# Patient Record
Sex: Female | Born: 1985 | Race: Black or African American | Hispanic: No | Marital: Married | Smoking: Never smoker
Health system: Southern US, Community
[De-identification: ages and names within clinical notes are randomized; demographics above are authoritative.]

## PROBLEM LIST (undated history)

## (undated) DIAGNOSIS — N760 Acute vaginitis: Secondary | ICD-10-CM

## (undated) DIAGNOSIS — N83209 Unspecified ovarian cyst, unspecified side: Secondary | ICD-10-CM

## (undated) DIAGNOSIS — H9190 Unspecified hearing loss, unspecified ear: Secondary | ICD-10-CM

## (undated) DIAGNOSIS — B009 Herpesviral infection, unspecified: Secondary | ICD-10-CM

## (undated) DIAGNOSIS — B9689 Other specified bacterial agents as the cause of diseases classified elsewhere: Secondary | ICD-10-CM

## (undated) DIAGNOSIS — A599 Trichomoniasis, unspecified: Secondary | ICD-10-CM

## (undated) DIAGNOSIS — N883 Incompetence of cervix uteri: Secondary | ICD-10-CM

## (undated) HISTORY — DX: Herpesviral infection, unspecified: B00.9

## (undated) HISTORY — DX: Unspecified ovarian cyst, unspecified side: N83.209

## (undated) HISTORY — PX: THERAPEUTIC ABORTION: SHX798

## (undated) HISTORY — DX: Incompetence of cervix uteri: N88.3

## (undated) HISTORY — PX: WISDOM TOOTH EXTRACTION: SHX21

## (undated) HISTORY — DX: Unspecified hearing loss, unspecified ear: H91.90

---

## 2017-09-20 ENCOUNTER — Inpatient Hospital Stay (HOSPITAL_COMMUNITY)
Admission: AD | Admit: 2017-09-20 | Discharge: 2017-09-20 | Disposition: A | Discharge status: Home / Self Care | Payer: Self-pay | Source: Ambulatory Visit | Attending: Obstetrics and Gynecology | Admitting: Obstetrics and Gynecology

## 2017-09-20 ENCOUNTER — Encounter (HOSPITAL_COMMUNITY): Payer: Self-pay

## 2017-09-20 DIAGNOSIS — R109 Unspecified abdominal pain: Secondary | ICD-10-CM | POA: Insufficient documentation

## 2017-09-20 DIAGNOSIS — N911 Secondary amenorrhea: Secondary | ICD-10-CM

## 2017-09-20 DIAGNOSIS — N912 Amenorrhea, unspecified: Secondary | ICD-10-CM | POA: Insufficient documentation

## 2017-09-20 DIAGNOSIS — Z3202 Encounter for pregnancy test, result negative: Secondary | ICD-10-CM | POA: Insufficient documentation

## 2017-09-20 HISTORY — DX: Other specified bacterial agents as the cause of diseases classified elsewhere: B96.89

## 2017-09-20 HISTORY — DX: Trichomoniasis, unspecified: A59.9

## 2017-09-20 HISTORY — DX: Acute vaginitis: N76.0

## 2017-09-20 LAB — CBC
HCT: 41.4 % (ref 36.0–46.0)
HEMOGLOBIN: 14.2 g/dL (ref 12.0–15.0)
MCH: 28.8 pg (ref 26.0–34.0)
MCHC: 34.3 g/dL (ref 30.0–36.0)
MCV: 84 fL (ref 78.0–100.0)
PLATELETS: 274 10*3/uL (ref 150–400)
RBC: 4.93 MIL/uL (ref 3.87–5.11)
RDW: 14.5 % (ref 11.5–15.5)
WBC: 6.5 10*3/uL (ref 4.0–10.5)

## 2017-09-20 LAB — WET PREP, GENITAL
Clue Cells Wet Prep HPF POC: NONE SEEN
SPERM: NONE SEEN
Trich, Wet Prep: NONE SEEN
Yeast Wet Prep HPF POC: NONE SEEN

## 2017-09-20 LAB — URINALYSIS, ROUTINE W REFLEX MICROSCOPIC
BILIRUBIN URINE: NEGATIVE
Glucose, UA: NEGATIVE mg/dL
Hgb urine dipstick: NEGATIVE
Ketones, ur: NEGATIVE mg/dL
Leukocytes, UA: NEGATIVE
Nitrite: NEGATIVE
Protein, ur: NEGATIVE mg/dL
SPECIFIC GRAVITY, URINE: 1.028 (ref 1.005–1.030)
pH: 6 (ref 5.0–8.0)

## 2017-09-20 LAB — HEPATITIS B SURFACE ANTIGEN: HEP B S AG: NEGATIVE

## 2017-09-20 LAB — POCT PREGNANCY, URINE: PREG TEST UR: NEGATIVE

## 2017-09-20 MED ORDER — KETOROLAC TROMETHAMINE 30 MG/ML IJ SOLN
60.0000 mg | Freq: Once | INTRAMUSCULAR | Status: DC
  Filled 2017-09-20: qty 2

## 2017-09-20 MED ORDER — KETOROLAC TROMETHAMINE 60 MG/2ML IM SOLN
60.0000 mg | Freq: Once | INTRAMUSCULAR | Status: AC
  Administered 2017-09-20: 60 mg via INTRAMUSCULAR
  Filled 2017-09-20: qty 2

## 2017-09-20 MED ORDER — IBUPROFEN 800 MG PO TABS: 800.0000 mg | ORAL_TABLET | Freq: Three times a day (TID) | ORAL | 0 refills | Status: DC | PRN

## 2017-09-20 NOTE — Discharge Instructions (Signed)
Secondary Amenorrhea Secondary amenorrhea is the stopping of menstrual flow for 3-6 months in a female who has previously had periods. There are many possible causes. Most of these causes are not serious. Usually, treating the underlying problem causing the loss of menses will return your periods to normal. What are the causes? Some common and uncommon causes of not menstruating include:  Malnutrition.  Low blood sugar (hypoglycemia).  Polycystic ovary disease.  Stress or fear.  Breastfeeding.  Hormone imbalance.  Ovarian failure.  Medicines.  Extreme obesity.  Cystic fibrosis.  Low body weight or drastic weight reduction from any cause.  Early menopause.  Removal of ovaries or uterus.  Contraceptives.  Illness.  Long-term (chronic) illnesses.  Cushing syndrome.  Thyroid problems.  Birth control pills, patches, or vaginal rings for birth control.  What increases the risk? You may be at greater risk of secondary amenorrhea if:  You have a family history of this condition.  You have an eating disorder.  You do athletic training.  How is this diagnosed? A diagnosis is made by your health care provider taking a medical history and doing a physical exam. This will include a pelvic exam to check for problems with your reproductive organs. Pregnancy must be ruled out. Often, numerous blood tests are done to measure different hormones in the body. Urine testing may be done. Specialized exams (ultrasound, CT scan, MRI, or hysteroscopy) may have to be done as well as measuring the body mass index (BMI). How is this treated? Treatment depends on the cause of the amenorrhea. If an eating disorder is present, this can be treated with an adequate diet and therapy. Chronic illnesses may improve with treatment of the illness. Amenorrhea may be corrected with medicines, lifestyle changes, or surgery. If the amenorrhea cannot be corrected, it is sometimes possible to create a  false menstruation with medicines. Follow these instructions at home:  Maintain a healthy diet.  Manage weight problems.  Exercise regularly but not excessively.  Get adequate sleep.  Manage stress.  Be aware of changes in your menstrual cycle. Keep a record of when your periods occur. Note the date your period starts, how long it lasts, and any problems. Contact a health care provider if: Your symptoms do not get better with treatment. This information is not intended to replace advice given to you by your health care provider. Make sure you discuss any questions you have with your health care provider. Document Released: 08/02/2006 Document Revised: 11/27/2015 Document Reviewed: 12/07/2012 Elsevier Interactive Patient Education  2018 Elsevier Inc.  

## 2017-09-20 NOTE — MAU Provider Note (Signed)
History     CSN: [id number]  Arrival date and time: 09/20/17 1239   First Provider Initiated Contact with Patient 09/20/17 1552      Chief Complaint  Patient presents with  . Possible Pregnancy  . Abdominal Pain  . Nausea   32 y.o. non-pregnant female here with missed menses, abdominal pain, nausea, and back pain. Abd and back pain started about 1 month ago. Describes as intermittent and cramping. Took Ibuprofen but didn't help much. No fevers. No urinary sx. Denies vaginal discharge. Had a new partner recently but no longer together. Remote hx of trich. LMP was 07/20/17.    Past Medical History:  Diagnosis Date  . BV (bacterial vaginosis)   . Trichimoniasis     Past Surgical History:  Procedure Laterality Date  . CESAREAN SECTION    . THERAPEUTIC ABORTION      History reviewed. No pertinent family history.  Social History   Tobacco Use  . Smoking status: Never Smoker  . Smokeless tobacco: Never Used  Substance Use Topics  . Alcohol use: Yes  . Drug use: No    Allergies: No Known Allergies  Medications Prior to Admission  Medication Sig Dispense Refill Last Dose  . Multiple Vitamin (MULTIVITAMIN WITH MINERALS) TABS tablet Take 1 tablet by mouth daily.   09/20/2017 at Unknown time    Review of Systems  Constitutional: Negative for chills and fever.  Gastrointestinal: Positive for abdominal pain and nausea. Negative for constipation, diarrhea and vomiting.  Genitourinary: Negative for dysuria, hematuria, urgency, vaginal bleeding and vaginal discharge.   Physical Exam   Blood pressure 123/73, pulse 84, temperature 98.9 F (37.2 C), temperature source Oral, resp. rate 15, height 5\' 4"  (1.626 m), weight 253 lb (114.8 kg), last menstrual period 07/20/2017, SpO2 100 %.  Physical Exam  Nursing note and vitals reviewed. Constitutional: She is oriented to person, place, and time. She appears well-developed and well-nourished. No distress.  HENT:  Head:  Normocephalic and atraumatic.  Neck: Normal range of motion.  Cardiovascular: Normal rate.  Respiratory: Effort normal. No respiratory distress.  GI: Soft. She exhibits no distension and no mass. There is no tenderness. There is no rebound and no guarding.  Genitourinary:  Genitourinary Comments: External: no lesions or erythema Vagina: rugated, pink, moist, mucopurulent discharge from os Uterus: non enlarged, anteverted, non tender, no CMT Adnexae: no masses, no tenderness left, no tenderness right    Musculoskeletal: Normal range of motion.  Neurological: She is alert and oriented to person, place, and time.  Skin: Skin is warm and dry.  Psychiatric: She has a normal mood and affect.   Results for orders placed or performed during the hospital encounter of 09/20/17 (from the past 24 hour(s))  Urinalysis, Routine w reflex microscopic     Status: None   Collection Time: 09/20/17  1:25 PM  Result Value Ref Range   Color, Urine YELLOW YELLOW   APPearance CLEAR CLEAR   Specific Gravity, Urine 1.028 1.005 - 1.030   pH 6.0 5.0 - 8.0   Glucose, UA NEGATIVE NEGATIVE mg/dL   Hgb urine dipstick NEGATIVE NEGATIVE   Bilirubin Urine NEGATIVE NEGATIVE   Ketones, ur NEGATIVE NEGATIVE mg/dL   Protein, ur NEGATIVE NEGATIVE mg/dL   Nitrite NEGATIVE NEGATIVE   Leukocytes, UA NEGATIVE NEGATIVE  Pregnancy, urine POC     Status: None   Collection Time: 09/20/17  1:57 PM  Result Value Ref Range   Preg Test, Ur NEGATIVE NEGATIVE  Wet prep, genital  Status: Abnormal   Collection Time: 09/20/17  4:40 PM  Result Value Ref Range   Yeast Wet Prep HPF POC NONE SEEN NONE SEEN   Trich, Wet Prep NONE SEEN NONE SEEN   Clue Cells Wet Prep HPF POC NONE SEEN NONE SEEN   WBC, Wet Prep HPF POC MODERATE (A) NONE SEEN   Sperm NONE SEEN   CBC     Status: None   Collection Time: 09/20/17  5:04 PM  Result Value Ref Range   WBC 6.5 4.0 - 10.5 K/uL   RBC 4.93 3.87 - 5.11 MIL/uL   Hemoglobin 14.2 12.0 - 15.0  g/dL   HCT 57.8 46.9 - 62.9 %   MCV 84.0 78.0 - 100.0 fL   MCH 28.8 26.0 - 34.0 pg   MCHC 34.3 30.0 - 36.0 g/dL   RDW 52.8 41.3 - 24.4 %   Platelets 274 150 - 400 K/uL   MAU Course  Procedures Toradol  MDM Labs ordered and reviewed. Pain improved. No evidence of pregnancy. Suspect Chlamydia d/t cervical discharge. Will wait for culture result. No evidence of acute abdominal or pelvic process. Amenorrhea could reflect anovulatory cycle. Recommend HPT weekly until menses start. Stable for discharge home.  Assessment and Plan   1. Amenorrhea, secondary   2. Pregnancy examination or test, negative result    Discharge home Follow up with GYN provider of choice to start care Rx Ibuprofen  Allergies as of 09/20/2017   No Known Allergies     Medication List    TAKE these medications   ibuprofen 800 MG tablet Commonly known as:  ADVIL,MOTRIN Take 1 tablet (800 mg total) by mouth every 8 (eight) hours as needed.   multivitamin with minerals Tabs tablet Take 1 tablet by mouth daily.      Donette Larry, CNM 09/20/2017, 4:24 PM

## 2017-09-20 NOTE — MAU Provider Note (Signed)
CC: missed period  HPI: Ms. Emily Valenzuela is a 32yo female who presents with a missed period. Her LMP was 07/20/17. Her home pregnancy tests have been negative. She complains of generalized lower abominal cramping for the past month as well as lower back pain. She complains of nausea but no vomiting. She also notes a sharp pain in her breast that happens occasionally. She is sexually active. She denies vaginal bleeding, dysuria, fever, or chills.  Objective:  PE:  General: well appearing, in no distress CV: RRR Pulmonary: CTAB Abdominal: mild generalized lower abdominal tenderness to palpation no organomegaly or masses. Pelvic: mucopurulent discharge from the cervical os otherwise normal exam. Bimanual exam revealed no masses or CMT.  Urinalysis    Component Value Date/Time   COLORURINE YELLOW 09/20/2017 1325   APPEARANCEUR CLEAR 09/20/2017 1325   LABSPEC 1.028 09/20/2017 1325   PHURINE 6.0 09/20/2017 1325   GLUCOSEU NEGATIVE 09/20/2017 1325   HGBUR NEGATIVE 09/20/2017 1325   BILIRUBINUR NEGATIVE 09/20/2017 1325   KETONESUR NEGATIVE 09/20/2017 1325   PROTEINUR NEGATIVE 09/20/2017 1325   NITRITE NEGATIVE 09/20/2017 1325   LEUKOCYTESUR NEGATIVE 09/20/2017 1325   Pregnancy test negative  Wet mount  G/c - pending  A/P: Ms. Emily Valenzuela is a 32 yo female who presents with a missed period and abdominal cramping. Her pregnancy test was negative today. Given her pelvic exam concern for STI.  Plan:  -G/C, [place], RPR, HIV, HepB, CBC

## 2017-09-20 NOTE — MAU Note (Signed)
Pt reports she is two months late for her period. Has done multiple preg test and they have all been negative but pt states she is having nausea sometimes, urinary frequency and states she has never been this late for her period.

## 2017-09-20 NOTE — Progress Notes (Signed)
Pt signed paper discharge instructions, put with chart.

## 2017-09-21 LAB — GC/CHLAMYDIA PROBE AMP (~~LOC~~) NOT AT ARMC
Chlamydia: NEGATIVE
Neisseria Gonorrhea: NEGATIVE

## 2017-09-21 LAB — RPR: RPR Ser Ql: NONREACTIVE

## 2017-09-21 LAB — HIV ANTIBODY (ROUTINE TESTING W REFLEX): HIV SCREEN 4TH GENERATION: NONREACTIVE

## 2018-10-25 DIAGNOSIS — Z6841 Body Mass Index (BMI) 40.0 and over, adult: Secondary | ICD-10-CM | POA: Diagnosis not present

## 2018-10-25 DIAGNOSIS — R635 Abnormal weight gain: Secondary | ICD-10-CM | POA: Diagnosis not present

## 2018-10-25 DIAGNOSIS — F064 Anxiety disorder due to known physiological condition: Secondary | ICD-10-CM | POA: Diagnosis not present

## 2018-10-25 DIAGNOSIS — F3289 Other specified depressive episodes: Secondary | ICD-10-CM | POA: Diagnosis not present

## 2018-10-25 DIAGNOSIS — I10 Essential (primary) hypertension: Secondary | ICD-10-CM | POA: Diagnosis not present

## 2018-12-13 DIAGNOSIS — E785 Hyperlipidemia, unspecified: Secondary | ICD-10-CM | POA: Diagnosis not present

## 2018-12-13 DIAGNOSIS — E669 Obesity, unspecified: Secondary | ICD-10-CM | POA: Diagnosis not present

## 2019-02-09 ENCOUNTER — Emergency Department (HOSPITAL_COMMUNITY)
Admission: EM | Admit: 2019-02-09 | Discharge: 2019-02-10 | Disposition: A | Discharge status: Home / Self Care | Payer: BC Managed Care – PPO | Attending: Emergency Medicine | Admitting: Emergency Medicine

## 2019-02-09 ENCOUNTER — Emergency Department (HOSPITAL_COMMUNITY): Payer: BC Managed Care – PPO

## 2019-02-09 ENCOUNTER — Encounter (HOSPITAL_COMMUNITY): Payer: Self-pay | Admitting: Emergency Medicine

## 2019-02-09 ENCOUNTER — Other Ambulatory Visit: Payer: Self-pay

## 2019-02-09 DIAGNOSIS — Z79899 Other long term (current) drug therapy: Secondary | ICD-10-CM | POA: Insufficient documentation

## 2019-02-09 DIAGNOSIS — R079 Chest pain, unspecified: Secondary | ICD-10-CM | POA: Diagnosis not present

## 2019-02-09 DIAGNOSIS — F419 Anxiety disorder, unspecified: Secondary | ICD-10-CM | POA: Insufficient documentation

## 2019-02-09 LAB — CBC
HCT: 43.4 % (ref 36.0–46.0)
Hemoglobin: 14.3 g/dL (ref 12.0–15.0)
MCH: 28.4 pg (ref 26.0–34.0)
MCHC: 32.9 g/dL (ref 30.0–36.0)
MCV: 86.3 fL (ref 80.0–100.0)
Platelets: 348 10*3/uL (ref 150–400)
RBC: 5.03 MIL/uL (ref 3.87–5.11)
RDW: 13.7 % (ref 11.5–15.5)
WBC: 6.5 10*3/uL (ref 4.0–10.5)
nRBC: 0 % (ref 0.0–0.2)

## 2019-02-09 LAB — BASIC METABOLIC PANEL
Anion gap: 10 (ref 5–15)
BUN: 9 mg/dL (ref 6–20)
CO2: 23 mmol/L (ref 22–32)
Calcium: 9.4 mg/dL (ref 8.9–10.3)
Chloride: 100 mmol/L (ref 98–111)
Creatinine, Ser: 0.83 mg/dL (ref 0.44–1.00)
GFR calc Af Amer: >60 mL/min (ref >60)
GFR calc non Af Amer: >60 mL/min (ref >60)
Glucose, Bld: 105 mg/dL — ABNORMAL HIGH (ref 70–99)
Potassium: 3.5 mmol/L (ref 3.5–5.1)
Sodium: 133 mmol/L — ABNORMAL LOW (ref 135–145)

## 2019-02-09 LAB — I-STAT BETA HCG BLOOD, ED (MC, WL, AP ONLY): I-stat hCG, quantitative: <5 m[IU]/mL (ref <5)

## 2019-02-09 LAB — TROPONIN I (HIGH SENSITIVITY): Troponin I (High Sensitivity): <2 ng/L (ref <18)

## 2019-02-09 MED ORDER — SODIUM CHLORIDE 0.9% FLUSH: 3.0000 mL | Freq: Once | INTRAVENOUS | Status: DC

## 2019-02-09 NOTE — ED Triage Notes (Signed)
Pt here for eval of anxietyrelated to recent loss of her friend. States she began having centralized chest pain that radiates to the left shoulder blade since yesterday. Pt also has generalized ankle pain to left ankle.

## 2019-02-10 MED ORDER — ACETAMINOPHEN 500 MG PO TABS
1000.0000 mg | ORAL_TABLET | Freq: Once | ORAL | Status: AC
  Administered 2019-02-10: 1000 mg via ORAL
  Filled 2019-02-10: qty 2

## 2019-02-10 NOTE — ED Notes (Signed)
Cancel tropon

## 2019-02-10 NOTE — ED Provider Notes (Signed)
[facility]   CSN: [id number] Arrival date & time: 02/09/19  1837     History   Chief Complaint Chief Complaint  Patient presents with  . Anxiety  . Chest Pain    HPI Emily Valenzuela is a 33 y.o. female.     Patient presents to the emergency department with a chief complaint of chest pain.  She states that her symptoms started a day or so ago.  She states that she recently lost 1 of her friends.  She has had several people calling her to talk about it, and she states that this is made her feel very anxious.  She reports associated chest pain.  She denies any shortness of breath, or cough.  She denies any history of PE or DVT.  Denies any recent long travel, immobilization, OCPs.  She has not taken anything for symptoms.  Symptoms are worsened when thinking about/talking about her friend.  The history is provided by the patient. No language interpreter was used.    Past Medical History:  Diagnosis Date  . BV (bacterial vaginosis)   . Trichimoniasis     There are no active problems to display for this patient.   Past Surgical History:  Procedure Laterality Date  . CESAREAN SECTION    . THERAPEUTIC ABORTION       OB History    Gravida  3   Para  1   Term      Preterm  1   AB  2   Living        SAB  1   TAB  1   Ectopic      Multiple      Live Births               Home Medications    Prior to Admission medications   Medication Sig Start Date End Date Taking? Authorizing Provider  ibuprofen (ADVIL,MOTRIN) 800 MG tablet Take 1 tablet (800 mg total) by mouth every 8 (eight) hours as needed. 09/20/17   Donette LarryBhambri, Melanie, CNM  Multiple Vitamin (MULTIVITAMIN WITH MINERALS) TABS tablet Take 1 tablet by mouth daily.    [provider]    Family History No family history on file.  Social History Social History   Tobacco Use  . Smoking status: Never Smoker  . Smokeless tobacco: Never Used   Substance Use Topics  . Alcohol use: Yes  . Drug use: No     Allergies   Patient has no known allergies.   Review of Systems Review of Systems  All other systems reviewed and are negative.    Physical Exam Updated Vital Signs BP 131/83 (BP Location: Left Arm)   Pulse 80   Temp 98.7 F (37.1 C) (Oral)   Resp 16   LMP 02/09/2019   SpO2 100%   Physical Exam Vitals signs and nursing note reviewed.  Constitutional:      General: She is not in acute distress.    Appearance: She is well-developed.  HENT:     Head: Normocephalic and atraumatic.  Eyes:     Conjunctiva/sclera: Conjunctivae normal.  Neck:     Musculoskeletal: Neck supple.  Cardiovascular:     Rate and Rhythm: Normal rate and regular rhythm.     Heart sounds: No murmur.  Pulmonary:     Effort: Pulmonary effort is normal. No respiratory distress.     Breath sounds: Normal breath sounds.  Abdominal:  Palpations: Abdomen is soft.     Tenderness: There is no abdominal tenderness.  Musculoskeletal: Normal range of motion.  Skin:    General: Skin is warm and dry.  Neurological:     Mental Status: She is alert and oriented to person, place, and time.      ED Treatments / Results  Labs (all labs ordered are listed, but only abnormal results are displayed) Labs Reviewed  BASIC METABOLIC PANEL - Abnormal; Notable for the following components:      Result Value   Sodium 133 (*)    Glucose, Bld 105 (*)    All other components within normal limits  CBC  I-STAT BETA HCG BLOOD, ED ([facility], [facility], [facility] ONLY)  TROPONIN I (HIGH SENSITIVITY)  TROPONIN I (HIGH SENSITIVITY)    EKG None  Radiology Dg Chest 2 View  Result Date: 02/09/2019 CLINICAL DATA:  Chest pain. EXAM: CHEST - 2 VIEW COMPARISON:  None. FINDINGS: The heart size and mediastinal contours are within normal limits. Both lungs are clear. The visualized skeletal structures are unremarkable. IMPRESSION: No active cardiopulmonary disease. Electronically  Signed   By: Dorise Bullion III M.D   On: 02/09/2019 19:39    Procedures Procedures (including critical care time)  Medications Ordered in ED Medications  sodium chloride flush (NS) 0.9 % injection 3 mL (has no administration in time range)     Initial Impression / Assessment and Plan / ED Course  I have reviewed the triage vital signs and the nursing notes.  Pertinent labs & imaging results that were available during my care of the patient were reviewed by me and considered in my medical decision making (see chart for details).        Patient with chest pain that started yesterday.  Troponin negative.  Low risk for ACS based on age and lack of risk factors.  Low risk for PE, PERC negative.  Recently lost 1 of her friends.  Likely anxiety/stress induced.  EKG is reassuring.  We will discharged home with primary care follow-up.  Encourage patient to consider talking with a Social worker.  Final Clinical Impressions(s) / ED Diagnoses   Final diagnoses:  Anxiety  Chest pain, unspecified type    ED Discharge Orders    None       Montine Circle, PA-C 62/69/48 [id number]    Delora Fuel, MD 70/35/00 [phone]

## 2019-02-13 DIAGNOSIS — F411 Generalized anxiety disorder: Secondary | ICD-10-CM | POA: Diagnosis not present

## 2019-02-15 DIAGNOSIS — F3289 Other specified depressive episodes: Secondary | ICD-10-CM | POA: Diagnosis not present

## 2019-02-15 DIAGNOSIS — E669 Obesity, unspecified: Secondary | ICD-10-CM | POA: Diagnosis not present

## 2019-02-15 DIAGNOSIS — I10 Essential (primary) hypertension: Secondary | ICD-10-CM | POA: Diagnosis not present

## 2019-02-15 DIAGNOSIS — M940 Chondrocostal junction syndrome [Tietze]: Secondary | ICD-10-CM | POA: Diagnosis not present

## 2019-02-20 DIAGNOSIS — F411 Generalized anxiety disorder: Secondary | ICD-10-CM | POA: Diagnosis not present

## 2019-02-23 ENCOUNTER — Other Ambulatory Visit: Payer: Self-pay

## 2019-06-07 ENCOUNTER — Other Ambulatory Visit: Payer: Self-pay

## 2019-06-07 DIAGNOSIS — Z20822 Contact with and (suspected) exposure to covid-19: Secondary | ICD-10-CM

## 2019-06-09 LAB — NOVEL CORONAVIRUS, NAA: SARS-CoV-2, NAA: NOT DETECTED

## 2019-09-21 DIAGNOSIS — H5213 Myopia, bilateral: Secondary | ICD-10-CM | POA: Diagnosis not present

## 2019-09-27 ENCOUNTER — Ambulatory Visit: Payer: BC Managed Care – PPO

## 2019-10-12 DIAGNOSIS — N883 Incompetence of cervix uteri: Secondary | ICD-10-CM | POA: Diagnosis not present

## 2019-10-12 DIAGNOSIS — Z113 Encounter for screening for infections with a predominantly sexual mode of transmission: Secondary | ICD-10-CM | POA: Diagnosis not present

## 2019-10-12 DIAGNOSIS — Z01419 Encounter for gynecological examination (general) (routine) without abnormal findings: Secondary | ICD-10-CM | POA: Diagnosis not present

## 2019-10-12 DIAGNOSIS — Z6841 Body Mass Index (BMI) 40.0 and over, adult: Secondary | ICD-10-CM | POA: Diagnosis not present

## 2019-10-12 DIAGNOSIS — R350 Frequency of micturition: Secondary | ICD-10-CM | POA: Diagnosis not present

## 2019-10-12 DIAGNOSIS — Z124 Encounter for screening for malignant neoplasm of cervix: Secondary | ICD-10-CM | POA: Diagnosis not present

## 2019-10-12 DIAGNOSIS — Z3040 Encounter for surveillance of contraceptives, unspecified: Secondary | ICD-10-CM | POA: Diagnosis not present

## 2019-10-17 DIAGNOSIS — B009 Herpesviral infection, unspecified: Secondary | ICD-10-CM | POA: Diagnosis not present

## 2019-10-22 ENCOUNTER — Ambulatory Visit: Payer: BC Managed Care – PPO

## 2019-11-20 DIAGNOSIS — N925 Other specified irregular menstruation: Secondary | ICD-10-CM | POA: Diagnosis not present

## 2019-11-28 DIAGNOSIS — O26859 Spotting complicating pregnancy, unspecified trimester: Secondary | ICD-10-CM | POA: Diagnosis not present

## 2019-11-28 DIAGNOSIS — Z3201 Encounter for pregnancy test, result positive: Secondary | ICD-10-CM | POA: Diagnosis not present

## 2019-11-28 DIAGNOSIS — Z113 Encounter for screening for infections with a predominantly sexual mode of transmission: Secondary | ICD-10-CM | POA: Diagnosis not present

## 2019-11-28 DIAGNOSIS — N911 Secondary amenorrhea: Secondary | ICD-10-CM | POA: Diagnosis not present

## 2019-11-28 DIAGNOSIS — Z8751 Personal history of pre-term labor: Secondary | ICD-10-CM | POA: Diagnosis not present

## 2019-11-29 ENCOUNTER — Other Ambulatory Visit (HOSPITAL_COMMUNITY): Payer: Self-pay | Admitting: Obstetrics and Gynecology

## 2019-11-29 DIAGNOSIS — O009 Unspecified ectopic pregnancy without intrauterine pregnancy: Secondary | ICD-10-CM

## 2019-12-11 ENCOUNTER — Ambulatory Visit
Admission: RE | Admit: 2019-12-11 | Discharge: 2019-12-11 | Disposition: A | Discharge status: Home / Self Care | Payer: Medicaid Other | Source: Ambulatory Visit | Attending: Obstetrics and Gynecology | Admitting: Obstetrics and Gynecology

## 2019-12-11 ENCOUNTER — Other Ambulatory Visit: Payer: Self-pay

## 2019-12-11 DIAGNOSIS — O009 Unspecified ectopic pregnancy without intrauterine pregnancy: Secondary | ICD-10-CM

## 2019-12-26 DIAGNOSIS — Z8751 Personal history of pre-term labor: Secondary | ICD-10-CM | POA: Diagnosis not present

## 2019-12-26 DIAGNOSIS — Z3A10 10 weeks gestation of pregnancy: Secondary | ICD-10-CM | POA: Diagnosis not present

## 2019-12-26 DIAGNOSIS — N883 Incompetence of cervix uteri: Secondary | ICD-10-CM | POA: Diagnosis not present

## 2019-12-26 DIAGNOSIS — O3680X9 Pregnancy with inconclusive fetal viability, other fetus: Secondary | ICD-10-CM | POA: Diagnosis not present

## 2019-12-26 DIAGNOSIS — N925 Other specified irregular menstruation: Secondary | ICD-10-CM | POA: Diagnosis not present

## 2019-12-26 DIAGNOSIS — S29011A Strain of muscle and tendon of front wall of thorax, initial encounter: Secondary | ICD-10-CM | POA: Diagnosis not present

## 2019-12-26 DIAGNOSIS — Z113 Encounter for screening for infections with a predominantly sexual mode of transmission: Secondary | ICD-10-CM | POA: Diagnosis not present

## 2020-02-06 DIAGNOSIS — Z3482 Encounter for supervision of other normal pregnancy, second trimester: Secondary | ICD-10-CM | POA: Diagnosis not present

## 2020-02-13 DIAGNOSIS — Z3A17 17 weeks gestation of pregnancy: Secondary | ICD-10-CM | POA: Diagnosis not present

## 2020-02-13 DIAGNOSIS — N760 Acute vaginitis: Secondary | ICD-10-CM | POA: Diagnosis not present

## 2020-02-13 DIAGNOSIS — Z363 Encounter for antenatal screening for malformations: Secondary | ICD-10-CM | POA: Diagnosis not present

## 2020-02-13 DIAGNOSIS — Z8751 Personal history of pre-term labor: Secondary | ICD-10-CM | POA: Diagnosis not present

## 2020-02-13 DIAGNOSIS — O09219 Supervision of pregnancy with history of pre-term labor, unspecified trimester: Secondary | ICD-10-CM | POA: Diagnosis not present

## 2020-02-18 DIAGNOSIS — Z362 Encounter for other antenatal screening follow-up: Secondary | ICD-10-CM | POA: Diagnosis not present

## 2020-02-19 ENCOUNTER — Other Ambulatory Visit: Payer: Self-pay | Admitting: Obstetrics & Gynecology

## 2020-02-20 ENCOUNTER — Encounter (HOSPITAL_COMMUNITY): Payer: Self-pay

## 2020-02-20 ENCOUNTER — Ambulatory Visit (HOSPITAL_COMMUNITY): Admit: 2020-02-20 | Payer: Medicaid Other | Admitting: Obstetrics & Gynecology

## 2020-02-20 SURGERY — CERCLAGE, CERVIX, VAGINAL APPROACH
Anesthesia: Choice

## 2020-02-23 NOTE — H&P (Signed)
Nara Paternoster is a 34 y.o. female presenting for history indicated prophylactic cerclage.  She is a G4P0120 at 18 weeks 5 days EGA with a history of cervical incompetence and preterm labor in her last pregnancy that resulted in 24 week delivery by cesarean section.  EDC is by LMP of 10/17/2019,EDC 07/23/2020 (confirmed by first trimester ultrasound).    OB History    Gravida  3   Para  1   Term      Preterm  1   AB  2   Living        SAB  1   TAB  1   Ectopic      Multiple      Live Births             Past Medical History:  Diagnosis Date  . BV (bacterial vaginosis)   . Trichimoniasis    Past Surgical History:  Procedure Laterality Date  . CESAREAN SECTION    . THERAPEUTIC ABORTION     Family History: family history is not on file. Social History:  reports that she has never smoked. She has never used smokeless tobacco. She reports current alcohol use. She reports that she does not use drugs.     Maternal Diabetes: No Genetic Screening: Normal Maternal Ultrasounds/Referrals: Normal Fetal Ultrasounds or other Referrals:  None Maternal Substance Abuse:  No Significant Maternal Medications:  Meds include: Progesterone Significant Maternal Lab Results:  None Other Comments:  None  Review of Systems  Constitutional: Denies fevers/chills Cardiovascular: Denies chest pain or palpitations Pulmonary: Denies coughing or wheezing Gastrointestinal: Denies nausea, vomiting or diarrhea Genitourinary: Denies pelvic pain, unusual vaginal bleeding, unusual vaginal discharge, dysuria, urgency or frequency.  Musculoskeletal: Denies muscle or joint aches and pain.  Neurology: Denies abnormal sensations such as tingling or numbness.   History   There were no vitals taken for this visit.  Blood pressure (!) 142/71, pulse 100, temperature 98.2 F (36.8 C), temperature source Oral, resp. rate 18, height 5\' 4"  (1.626 m), weight 124.3 kg, SpO2 100 %. Exam Physical Exam   Vitals reviewed. Constitutional: She is oriented to person, place, and time. She appears well-developed and well-nourished.  HENT:  Head: Normocephalic and atraumatic.  Eyes: Conjunctivae and EOM are normal.  Neck: Normal range of motion. Neck supple.  Cardiovascular: Normal rate, regular rhythm, normal heart sounds and intact distal pulses.  Respiratory: Effort normal and breath sounds normal.  GI: Soft. She exhibits no mass. There is no tenderness.  Genitourinary: Vagina normal and uterus normal.  Musculoskeletal: Normal range of motion.  Neurological: She is alert and oriented to person, place, and time.  Skin: Skin is warm and dry.  Psychiatric: She has a normal mood and affect. Judgment normal.   Current Outpatient Medications  Medication Instructions  . HYDROXYprogesterone Caproate (MAKENA IM) Intramuscular, Weekly  . ibuprofen (ADVIL) 800 mg, Oral, Every 8 hours PRN   Prenatal labs: ABO, Rh:  O pos Antibody:   Negative Rubella:  Immune RPR:   Non reactive  HBsAg:   Negative HIV:   Negative GBS:  Not done.   CBC    Component Value Date/Time   WBC 6.5 02/09/2019 1900   RBC 5.03 02/09/2019 1900   HGB 14.3 02/09/2019 1900   HCT 43.4 02/09/2019 1900   PLT 348 02/09/2019 1900   MCV 86.3 02/09/2019 1900   MCH 28.4 02/09/2019 1900   MCHC 32.9 02/09/2019 1900   RDW 13.7 02/09/2019 1900   CBC  Component Value Date/Time   WBC 6.9 02/25/2020 1250   RBC 4.37 02/25/2020 1250   HGB 12.2 02/25/2020 1250   HCT 36.7 02/25/2020 1250   PLT 295 02/25/2020 1250   MCV 84.0 02/25/2020 1250   MCH 27.9 02/25/2020 1250   MCHC 33.2 02/25/2020 1250   RDW 13.6 02/25/2020 1250    Assessment/Plan: 34 y/o G4P0120 at 18 weeks 5 days EGA with a history of cervical incompetence and preterm labor here for a prophylactic cerclage,  [facility] at [facility] NPO an IV fluids This procedure has been fully reviewed with the patient  and written informed consent has been obtained.  Konrad Felix, MD.  02/23/2020, 10:51 PM

## 2020-02-25 ENCOUNTER — Encounter (HOSPITAL_COMMUNITY): Payer: Self-pay | Admitting: Obstetrics & Gynecology

## 2020-02-25 ENCOUNTER — Encounter (HOSPITAL_COMMUNITY)
Admission: RE | Disposition: A | Discharge status: Home / Self Care | Payer: Self-pay | Source: Ambulatory Visit | Attending: Obstetrics & Gynecology

## 2020-02-25 ENCOUNTER — Observation Stay (HOSPITAL_COMMUNITY)
Admission: RE | Admit: 2020-02-25 | Discharge: 2020-02-25 | Disposition: A | Discharge status: Home / Self Care | Payer: 59 | Source: Ambulatory Visit | Attending: Obstetrics & Gynecology | Admitting: Obstetrics & Gynecology

## 2020-02-25 ENCOUNTER — Encounter (HOSPITAL_COMMUNITY): Payer: Self-pay | Admitting: Obstetrics and Gynecology

## 2020-02-25 ENCOUNTER — Other Ambulatory Visit: Payer: Self-pay

## 2020-02-25 ENCOUNTER — Inpatient Hospital Stay (HOSPITAL_COMMUNITY): Payer: 59 | Admitting: Anesthesiology

## 2020-02-25 DIAGNOSIS — Z20822 Contact with and (suspected) exposure to covid-19: Secondary | ICD-10-CM | POA: Insufficient documentation

## 2020-02-25 DIAGNOSIS — O3433 Maternal care for cervical incompetence, third trimester: Secondary | ICD-10-CM | POA: Diagnosis not present

## 2020-02-25 DIAGNOSIS — Z3A18 18 weeks gestation of pregnancy: Secondary | ICD-10-CM | POA: Diagnosis not present

## 2020-02-25 DIAGNOSIS — O3432 Maternal care for cervical incompetence, second trimester: Principal | ICD-10-CM | POA: Insufficient documentation

## 2020-02-25 DIAGNOSIS — N883 Incompetence of cervix uteri: Secondary | ICD-10-CM | POA: Diagnosis present

## 2020-02-25 HISTORY — PX: CERVICAL CERCLAGE: SHX1329

## 2020-02-25 LAB — TYPE AND SCREEN
ABO/RH(D): O POS
Antibody Screen: NEGATIVE

## 2020-02-25 LAB — CBC
HCT: 36.7 % (ref 36.0–46.0)
Hemoglobin: 12.2 g/dL (ref 12.0–15.0)
MCH: 27.9 pg (ref 26.0–34.0)
MCHC: 33.2 g/dL (ref 30.0–36.0)
MCV: 84 fL (ref 80.0–100.0)
Platelets: 295 10*3/uL (ref 150–400)
RBC: 4.37 MIL/uL (ref 3.87–5.11)
RDW: 13.6 % (ref 11.5–15.5)
WBC: 6.9 10*3/uL (ref 4.0–10.5)
nRBC: 0 % (ref 0.0–0.2)

## 2020-02-25 LAB — SARS CORONAVIRUS 2 BY RT PCR (HOSPITAL ORDER, PERFORMED IN ~~LOC~~ HOSPITAL LAB): SARS Coronavirus 2: NEGATIVE

## 2020-02-25 SURGERY — CERCLAGE, CERVIX, VAGINAL APPROACH
Anesthesia: Spinal | Site: Vagina | Wound class: Clean

## 2020-02-25 MED ORDER — MEPERIDINE HCL 25 MG/ML IJ SOLN: 6.2500 mg | INTRAMUSCULAR | Status: DC | PRN

## 2020-02-25 MED ORDER — OXYCODONE HCL 5 MG/5ML PO SOLN: 5.0000 mg | Freq: Once | ORAL | Status: AC | PRN

## 2020-02-25 MED ORDER — ACETAMINOPHEN 160 MG/5ML PO SOLN: 325.0000 mg | ORAL | Status: DC | PRN

## 2020-02-25 MED ORDER — SODIUM CHLORIDE 0.9 % IR SOLN
Status: DC | PRN
  Administered 2020-02-25: 1

## 2020-02-25 MED ORDER — BUPIVACAINE IN DEXTROSE 0.75-8.25 % IT SOLN
INTRATHECAL | Status: DC | PRN
  Administered 2020-02-25: 1 mL via INTRATHECAL

## 2020-02-25 MED ORDER — IBUPROFEN 800 MG PO TABS: 800.0000 mg | ORAL_TABLET | Freq: Three times a day (TID) | ORAL | 0 refills | Status: DC | PRN

## 2020-02-25 MED ORDER — ONDANSETRON HCL 4 MG/2ML IJ SOLN
INTRAMUSCULAR | Status: AC
  Filled 2020-02-25: qty 2

## 2020-02-25 MED ORDER — ACETAMINOPHEN 325 MG PO TABS: 325.0000 mg | ORAL_TABLET | ORAL | Status: DC | PRN

## 2020-02-25 MED ORDER — FENTANYL CITRATE (PF) 100 MCG/2ML IJ SOLN: 25.0000 ug | INTRAMUSCULAR | Status: DC | PRN

## 2020-02-25 MED ORDER — OXYCODONE HCL 5 MG PO TABS
5.0000 mg | ORAL_TABLET | Freq: Once | ORAL | Status: AC | PRN
  Administered 2020-02-25: 5 mg via ORAL

## 2020-02-25 MED ORDER — ONDANSETRON HCL 4 MG/2ML IJ SOLN: 4.0000 mg | Freq: Once | INTRAMUSCULAR | Status: DC | PRN

## 2020-02-25 MED ORDER — PHENYLEPHRINE 40 MCG/ML (10ML) SYRINGE FOR IV PUSH (FOR BLOOD PRESSURE SUPPORT)
PREFILLED_SYRINGE | INTRAVENOUS | Status: AC
  Filled 2020-02-25: qty 10

## 2020-02-25 MED ORDER — OXYCODONE HCL 5 MG PO TABS
ORAL_TABLET | ORAL | Status: AC
  Filled 2020-02-25: qty 1

## 2020-02-25 MED ORDER — PHENYLEPHRINE HCL (PRESSORS) 10 MG/ML IV SOLN
INTRAVENOUS | Status: DC | PRN
  Administered 2020-02-25 (×2): 40 ug via INTRAVENOUS
  Administered 2020-02-25: 120 ug via INTRAVENOUS

## 2020-02-25 MED ORDER — LACTATED RINGERS IV SOLN: INTRAVENOUS | Status: DC

## 2020-02-25 MED ORDER — POVIDONE-IODINE 10 % EX SWAB
2.0000 "application " | Freq: Once | CUTANEOUS | Status: AC
  Administered 2020-02-25: 2 via TOPICAL

## 2020-02-25 MED ORDER — ONDANSETRON HCL 4 MG/2ML IJ SOLN
INTRAMUSCULAR | Status: DC | PRN
  Administered 2020-02-25: 4 mg via INTRAVENOUS

## 2020-02-25 SURGICAL SUPPLY — 20 items
CANISTER SUCT 3000ML PPV (MISCELLANEOUS) ×3 IMPLANT
GLOVE BIO SURGEON STRL SZ 6.5 (GLOVE) ×2 IMPLANT
GLOVE BIO SURGEONS STRL SZ 6.5 (GLOVE) ×1
GLOVE BIOGEL PI IND STRL 6.5 (GLOVE) ×1 IMPLANT
GLOVE BIOGEL PI IND STRL 7.0 (GLOVE) ×1 IMPLANT
GLOVE BIOGEL PI INDICATOR 6.5 (GLOVE) ×2
GLOVE BIOGEL PI INDICATOR 7.0 (GLOVE) ×2
GOWN STRL REUS W/TWL LRG LVL3 (GOWN DISPOSABLE) ×6 IMPLANT
NS IRRIG 1000ML POUR BTL (IV SOLUTION) ×3 IMPLANT
PACK VAGINAL MINOR WOMEN LF (CUSTOM PROCEDURE TRAY) ×3 IMPLANT
PAD OB MATERNITY 4.3X12.25 (PERSONAL CARE ITEMS) ×3 IMPLANT
PAD PREP 24X48 CUFFED NSTRL (MISCELLANEOUS) ×3 IMPLANT
SUT MERSILENE 5MM BP 1 12 (SUTURE) IMPLANT
SUT PROLENE 1 CT 1 30 (SUTURE) ×3 IMPLANT
SUT SILK 2 0 SH (SUTURE) IMPLANT
TOWEL OR 17X24 6PK STRL BLUE (TOWEL DISPOSABLE) ×6 IMPLANT
TRAY FOLEY W/BAG SLVR 14FR (SET/KITS/TRAYS/PACK) ×3 IMPLANT
TUBING NON-CON 1/4 X 20 CONN (TUBING) IMPLANT
TUBING NON-CON 1/4 X 20' CONN (TUBING)
YANKAUER SUCT BULB TIP NO VENT (SUCTIONS) IMPLANT

## 2020-02-25 NOTE — Anesthesia Preprocedure Evaluation (Signed)
Anesthesia Evaluation  Patient identified by MRN, date of birth, ID band Patient awake    Reviewed: Allergy & Precautions, H&P , NPO status , Patient's Chart, lab work & pertinent test results, reviewed documented beta blocker date and time   Airway Mallampati: I  TM Distance: >3 FB Neck ROM: full    Dental no notable dental hx. (+) Teeth Intact, Dental Advisory Given   Pulmonary neg pulmonary ROS,    Pulmonary exam normal breath sounds clear to auscultation       Cardiovascular negative cardio ROS Normal cardiovascular exam Rhythm:regular Rate:Normal     Neuro/Psych negative neurological ROS  negative psych ROS   GI/Hepatic negative GI ROS, Neg liver ROS,   Endo/Other  Morbid obesity  Renal/GU negative Renal ROS  negative genitourinary   Musculoskeletal   Abdominal   Peds  Hematology negative hematology ROS (+)   Anesthesia Other Findings   Reproductive/Obstetrics (+) Pregnancy                             Anesthesia Physical Anesthesia Plan  ASA: III  Anesthesia Plan: Spinal   Post-op Pain Management:    Induction:   PONV Risk Score and Plan:   Airway Management Planned: Natural Airway  Additional Equipment:   Intra-op Plan:   Post-operative Plan:   Informed Consent: I have reviewed the patients History and Physical, chart, labs and discussed the procedure including the risks, benefits and alternatives for the proposed anesthesia with the patient or authorized representative who has indicated his/her understanding and acceptance.       Plan Discussed with: Anesthesiologist  Anesthesia Plan Comments:         Anesthesia Quick Evaluation

## 2020-02-25 NOTE — Anesthesia Procedure Notes (Signed)
Spinal  Patient location during procedure: OR Start time: 02/25/2020 3:20 PM End time: 02/25/2020 3:25 PM Staffing Anesthesiologist: Bethena Midget, MD Preanesthetic Checklist Completed: patient identified, IV checked, site marked, risks and benefits discussed, surgical consent, monitors and equipment checked, pre-op evaluation and timeout performed Spinal Block Patient position: sitting Prep: DuraPrep Patient monitoring: heart rate, cardiac monitor, continuous pulse ox and blood pressure Approach: midline Location: L3-4 Injection technique: single-shot Needle Needle type: Sprotte  Needle gauge: 24 G Needle length: 9 cm Assessment Sensory level: T4

## 2020-02-25 NOTE — Discharge Instructions (Signed)
  Emily Valenzuela,  Use tylenol/acetaminophen over the counter as needed for abdominal/vaginal cramping and pain. You may also use ibuprofen (okay to use between 14 and 28 weeks of pregnancy) or percocet only of the tylenol/acetaminophenl is not working, being mindful to keep not to exceed 4000 mg of acetaminophen in a 24 hour period.  Each percocet tablet contains 325 mg of acetaminophen.  Expect some light to moderate vaginal bleeding for the next few days.  Please call me at the office if the bleeding is excessive, requiring you to change a pad every 2 hours or less.  Do not place anything in the vagina until we we see you in the office in 2 weeks.  Do not use tampons or douche.  Please call the office if with any questions. Dr. Sallye Ober.

## 2020-02-25 NOTE — H&P (Deleted)
Patient: Emily Valenzuela, Emily Valenzuela DOB: 07/05/1985  MRN: [id number]  Preop diagnosis:  1. History of cervical incompetence 2. 18 week and 5 day EGA intrauterine pregnancy  Postop diagnosis: Same as above  Procedure: [employer] cervical cerclage  Surgeon: Dr. Sem Mccaughey  Assistants: None   Scrub technician: Melissa   Anesthesia: Spinal  Complications: None  IV fluid: 700 mL LR  EBL: 50 mL  Indications: 34-year-old G4 para 0120 at 18 weeks and 5 days with a history of incompetent cervix who is here for a history indicated prophylactic cerclage.    Procedures:   Informed consent was obtained from the patient after explaining the risks, benefits and alternatives of the procedure.  She was taken to the operating room where spinal anesthesia was administered.  She was prepped and draped in the usual sterile fashion.  An exam done revealed a closed cervix with about 50% effacement. There were no palpable adnexal masses.  A weighted speculum and retractor was placed in the vagina. The cervix was noted to be visually closed and about 2.5 cm long.  The anterior and posterior lips of the cervix was grasped with ring forceps. The 1-0 prolene suture was used to place a McDonald cerclage in purse-string suture around the cervix in the usual fashion at the cervical-vaginal junction and the suture was tied anteriorly.  Excellent hemostasis was noted at the end of this procedure. The instruments were then removed and the patient was cleaned. Count was correct. She was taken to the recovery room in stable condition  Specimen: None. Dr. Vickii Volland. 02/25/2020.  

## 2020-02-25 NOTE — Discharge Summary (Signed)
Discharge instructions and follow up care, appointments, and prescriptions reviewed with patient. Patient verbalizes understanding.  Discharged home in stable condition with family member.

## 2020-02-25 NOTE — Transfer of Care (Signed)
Immediate Anesthesia Transfer of Care Note  Patient: Emily Valenzuela  Procedure(s) Performed: CERCLAGE CERVICAL (N/A Vagina )  Patient Location: PACU  Anesthesia Type:Spinal  Level of Consciousness: awake, alert  and oriented  Airway & Oxygen Therapy: Patient Spontanous Breathing  Post-op Assessment: Report given to RN and Post -op Vital signs reviewed and stable  Post vital signs: Reviewed and stable  Last Vitals:  Vitals Value Taken Time  BP    Temp    Pulse    Resp    SpO2      Last Pain:  Vitals:   02/25/20 1245  TempSrc: Oral         Complications: No complications documented.

## 2020-02-25 NOTE — Anesthesia Postprocedure Evaluation (Signed)
Anesthesia Post Note  Patient: Emily Valenzuela  Procedure(s) Performed: CERCLAGE CERVICAL (N/A Vagina )     Patient location during evaluation: PACU Anesthesia Type: Spinal Level of consciousness: oriented and awake and alert Pain management: pain level controlled Vital Signs Assessment: post-procedure vital signs reviewed and stable Respiratory status: spontaneous breathing, respiratory function stable and nonlabored ventilation Cardiovascular status: blood pressure returned to baseline and stable Postop Assessment: no headache, no backache, no apparent nausea or vomiting, spinal receding and patient able to bend at knees Anesthetic complications: no   No complications documented.  Last Vitals:  Vitals:   02/25/20 1745 02/25/20 1815  BP: 131/81 132/75  Pulse: 71 79  Resp: 17 19  Temp:    SpO2: 99% 100%    Last Pain:  Vitals:   02/25/20 1837  TempSrc:   PainSc: 7    Pain Goal:                Epidural/Spinal Function Cutaneous sensation: Able to Wiggle Toes (02/25/20 1815), Patient able to flex knees: Yes (02/25/20 1815), Patient able to lift hips off bed: Yes (02/25/20 1815), Back pain beyond tenderness at insertion site: No (02/25/20 1815), Progressively worsening motor and/or sensory loss: No (02/25/20 1815), Bowel and/or bladder incontinence post epidural: No (02/25/20 1815)  Benjie Ricketson A.

## 2020-02-25 NOTE — Interval H&P Note (Signed)
History and Physical Interval Note:  02/25/2020 3:13 PM  Emily Valenzuela  has presented today for surgery, with the diagnosis of history of incompetent cervix.  The various methods of treatment have been discussed with the patient and family. After consideration of risks, benefits and other options for treatment, the patient has consented to  Procedure(s): CERCLAGE CERVICAL (N/A) as a surgical intervention.  The patient's history has been reviewed, patient examined, no change in status, stable for surgery.  I have reviewed the patient's chart and labs.  Questions were answered to the patient's satisfaction.    Konrad Felix, MD.

## 2020-02-26 ENCOUNTER — Encounter (HOSPITAL_COMMUNITY): Payer: Self-pay | Admitting: Obstetrics & Gynecology

## 2020-02-27 NOTE — Op Note (Signed)
Patient: Emily Valenzuela, Emily Valenzuela DOB: 1985-12-27  MRN: [id number]  Preop diagnosis:  1. History of cervical incompetence 2. 18 week and 5 day EGA intrauterine pregnancy  Postop diagnosis: Same as above  Procedure: McDonald's cervical cerclage  Surgeon: Dr. Hoover Browns  Assistants: None   Scrub technician: Efraim Kaufmann   Anesthesia: Spinal  Complications: None  IV fluid: 700 mL LR  EBL: 50 mL  Indications: 34 year old G4 para 0120 at 18 weeks and 5 days with a history of incompetent cervix who is here for a history indicated prophylactic cerclage.    Procedures:   Informed consent was obtained from the patient after explaining the risks, benefits and alternatives of the procedure.  She was taken to the operating room where spinal anesthesia was administered.  She was prepped and draped in the usual sterile fashion.  An exam done revealed a closed cervix with about 50% effacement. There were no palpable adnexal masses.  A weighted speculum and retractor was placed in the vagina. The cervix was noted to be visually closed and about 2.5 cm long.  The anterior and posterior lips of the cervix was grasped with ring forceps. The 1-0 prolene suture was used to place a McDonald cerclage in purse-string suture around the cervix in the usual fashion at the cervical-vaginal junction and the suture was tied anteriorly.  Excellent hemostasis was noted at the end of this procedure. The instruments were then removed and the patient was cleaned. Count was correct. She was taken to the recovery room in stable condition  Specimen: None. Dr. Sallye Ober. 02/25/2020.

## 2020-02-28 MED FILL — MAKENA 275 MG/1.1ML SOAJ: 275 | 28 days supply | Qty: 4 | Fill #0

## 2020-03-13 MED FILL — MAKENA 275 MG/1.1ML SOAJ: 275 | 28 days supply | Qty: 4 | Fill #0

## 2020-03-18 ENCOUNTER — Other Ambulatory Visit: Payer: Self-pay | Admitting: Obstetrics & Gynecology

## 2020-03-18 DIAGNOSIS — Z3689 Encounter for other specified antenatal screening: Secondary | ICD-10-CM

## 2020-03-18 DIAGNOSIS — Z3A23 23 weeks gestation of pregnancy: Secondary | ICD-10-CM

## 2020-03-18 DIAGNOSIS — O3432 Maternal care for cervical incompetence, second trimester: Secondary | ICD-10-CM

## 2020-03-27 ENCOUNTER — Encounter: Payer: Self-pay | Admitting: *Deleted

## 2020-03-28 ENCOUNTER — Other Ambulatory Visit: Payer: Self-pay | Admitting: *Deleted

## 2020-03-28 ENCOUNTER — Other Ambulatory Visit: Payer: Self-pay

## 2020-03-28 ENCOUNTER — Ambulatory Visit: Payer: 59 | Admitting: *Deleted

## 2020-03-28 ENCOUNTER — Ambulatory Visit: Payer: 59 | Attending: Obstetrics & Gynecology

## 2020-03-28 VITALS — BP 123/64 | HR 100

## 2020-03-28 DIAGNOSIS — O09899 Supervision of other high risk pregnancies, unspecified trimester: Secondary | ICD-10-CM | POA: Diagnosis not present

## 2020-03-28 DIAGNOSIS — Z363 Encounter for antenatal screening for malformations: Secondary | ICD-10-CM | POA: Diagnosis not present

## 2020-03-28 DIAGNOSIS — O3432 Maternal care for cervical incompetence, second trimester: Secondary | ICD-10-CM

## 2020-03-28 DIAGNOSIS — O34219 Maternal care for unspecified type scar from previous cesarean delivery: Secondary | ICD-10-CM | POA: Diagnosis not present

## 2020-03-28 DIAGNOSIS — Z3A23 23 weeks gestation of pregnancy: Secondary | ICD-10-CM | POA: Diagnosis not present

## 2020-03-28 DIAGNOSIS — O99212 Obesity complicating pregnancy, second trimester: Secondary | ICD-10-CM

## 2020-03-28 DIAGNOSIS — Z3689 Encounter for other specified antenatal screening: Secondary | ICD-10-CM

## 2020-03-28 DIAGNOSIS — O09219 Supervision of pregnancy with history of pre-term labor, unspecified trimester: Secondary | ICD-10-CM | POA: Diagnosis not present

## 2020-03-28 DIAGNOSIS — O30009 Twin pregnancy, unspecified number of placenta and unspecified number of amniotic sacs, unspecified trimester: Secondary | ICD-10-CM | POA: Diagnosis not present

## 2020-03-28 DIAGNOSIS — O322XX0 Maternal care for transverse and oblique lie, not applicable or unspecified: Secondary | ICD-10-CM

## 2020-04-17 ENCOUNTER — Other Ambulatory Visit: Payer: Self-pay | Admitting: Obstetrics and Gynecology

## 2020-04-25 ENCOUNTER — Ambulatory Visit: Payer: Medicaid Other

## 2020-04-25 ENCOUNTER — Ambulatory Visit: Payer: Medicaid Other | Attending: Obstetrics and Gynecology

## 2020-05-15 ENCOUNTER — Other Ambulatory Visit: Payer: Self-pay | Admitting: Obstetrics and Gynecology

## 2020-05-15 ENCOUNTER — Other Ambulatory Visit: Payer: Self-pay | Admitting: Obstetrics & Gynecology

## 2020-05-15 DIAGNOSIS — O09899 Supervision of other high risk pregnancies, unspecified trimester: Secondary | ICD-10-CM

## 2020-05-15 DIAGNOSIS — Z6841 Body Mass Index (BMI) 40.0 and over, adult: Secondary | ICD-10-CM

## 2020-05-27 ENCOUNTER — Encounter: Payer: Self-pay | Admitting: *Deleted

## 2020-05-27 ENCOUNTER — Ambulatory Visit: Payer: 59 | Admitting: *Deleted

## 2020-05-27 ENCOUNTER — Other Ambulatory Visit: Payer: Self-pay

## 2020-05-27 ENCOUNTER — Other Ambulatory Visit: Payer: Self-pay | Admitting: *Deleted

## 2020-05-27 ENCOUNTER — Ambulatory Visit: Payer: 59 | Attending: Obstetrics and Gynecology

## 2020-05-27 VITALS — BP 124/58 | HR 99

## 2020-05-27 DIAGNOSIS — O3433 Maternal care for cervical incompetence, third trimester: Secondary | ICD-10-CM

## 2020-05-27 DIAGNOSIS — Z6841 Body Mass Index (BMI) 40.0 and over, adult: Secondary | ICD-10-CM | POA: Diagnosis present

## 2020-05-27 DIAGNOSIS — O09213 Supervision of pregnancy with history of pre-term labor, third trimester: Secondary | ICD-10-CM

## 2020-05-27 DIAGNOSIS — O99213 Obesity complicating pregnancy, third trimester: Secondary | ICD-10-CM

## 2020-05-27 DIAGNOSIS — O09899 Supervision of other high risk pregnancies, unspecified trimester: Secondary | ICD-10-CM | POA: Diagnosis present

## 2020-05-27 DIAGNOSIS — Z363 Encounter for antenatal screening for malformations: Secondary | ICD-10-CM

## 2020-05-27 DIAGNOSIS — O34219 Maternal care for unspecified type scar from previous cesarean delivery: Secondary | ICD-10-CM | POA: Diagnosis not present

## 2020-05-27 DIAGNOSIS — Z3A31 31 weeks gestation of pregnancy: Secondary | ICD-10-CM

## 2020-06-18 NOTE — Patient Instructions (Addendum)
Emily Valenzuela  06/18/2020   Your procedure is scheduled on:  07/02/2020  Arrive at 0730 at Entrance C on [employer] at [facility]  and [employer]. You are invited to use the FREE valet parking or use the Visitor's parking deck.  Pick up the phone at the desk and dial [phone].  Call this number if you have problems the morning of surgery: [phone]  Remember:   Do not eat food:(After Midnight) Desps de medianoche.  Do not drink clear liquids: (After Midnight) Desps de medianoche.  Take these medicines the morning of surgery with A SIP OF WATER:  Take valtrex as prescribed   Do not wear jewelry, make-up or nail polish.  Do not wear lotions, powders, or perfumes. Do not wear deodorant.  Do not shave 48 hours prior to surgery.  Do not bring valuables to the hospital.  [facility] is not   responsible for any belongings or valuables brought to the hospital.  Contacts, dentures or bridgework may not be worn into surgery.  Leave suitcase in the car. After surgery it may be brought to your room.  For patients admitted to the hospital, checkout time is 11:00 AM the day of              discharge.      Please read over the following fact sheets that you were given:     Preparing for Surgery

## 2020-06-20 ENCOUNTER — Encounter (HOSPITAL_COMMUNITY): Payer: Self-pay

## 2020-06-25 ENCOUNTER — Ambulatory Visit: Payer: Medicaid Other

## 2020-06-30 ENCOUNTER — Encounter (HOSPITAL_COMMUNITY)
Admission: RE | Admit: 2020-06-30 | Discharge: 2020-06-30 | Disposition: A | Discharge status: Home / Self Care | Source: Ambulatory Visit | Attending: Obstetrics and Gynecology | Admitting: Obstetrics and Gynecology

## 2020-06-30 ENCOUNTER — Other Ambulatory Visit: Payer: Self-pay

## 2020-06-30 ENCOUNTER — Other Ambulatory Visit (HOSPITAL_COMMUNITY)
Admission: RE | Admit: 2020-06-30 | Discharge: 2020-06-30 | Disposition: A | Discharge status: Home / Self Care | Source: Ambulatory Visit | Attending: Obstetrics and Gynecology | Admitting: Obstetrics and Gynecology

## 2020-06-30 DIAGNOSIS — Z01812 Encounter for preprocedural laboratory examination: Secondary | ICD-10-CM | POA: Insufficient documentation

## 2020-06-30 DIAGNOSIS — Z20822 Contact with and (suspected) exposure to covid-19: Secondary | ICD-10-CM | POA: Insufficient documentation

## 2020-06-30 LAB — CBC
HCT: 34.6 % — ABNORMAL LOW (ref 36.0–46.0)
Hemoglobin: 11.8 g/dL — ABNORMAL LOW (ref 12.0–15.0)
MCH: 27.2 pg (ref 26.0–34.0)
MCHC: 34.1 g/dL (ref 30.0–36.0)
MCV: 79.7 fL — ABNORMAL LOW (ref 80.0–100.0)
Platelets: 316 10*3/uL (ref 150–400)
RBC: 4.34 MIL/uL (ref 3.87–5.11)
RDW: 16.2 % — ABNORMAL HIGH (ref 11.5–15.5)
WBC: 5.9 10*3/uL (ref 4.0–10.5)
nRBC: 0 % (ref 0.0–0.2)

## 2020-06-30 LAB — TYPE AND SCREEN
ABO/RH(D): O POS
Antibody Screen: NEGATIVE

## 2020-06-30 LAB — SARS CORONAVIRUS 2 (TAT 6-24 HRS): SARS Coronavirus 2: NEGATIVE

## 2020-07-01 LAB — RPR: RPR Ser Ql: NONREACTIVE

## 2020-07-02 ENCOUNTER — Inpatient Hospital Stay (HOSPITAL_COMMUNITY)
Admission: RE | Admit: 2020-07-02 | Discharge: 2020-07-04 | DRG: 786 | Disposition: A | Discharge status: Home / Self Care | Attending: Obstetrics and Gynecology | Admitting: Obstetrics and Gynecology

## 2020-07-02 ENCOUNTER — Inpatient Hospital Stay (HOSPITAL_COMMUNITY): Admitting: Anesthesiology

## 2020-07-02 ENCOUNTER — Encounter (HOSPITAL_COMMUNITY): Payer: Self-pay | Admitting: Obstetrics and Gynecology

## 2020-07-02 ENCOUNTER — Encounter (HOSPITAL_COMMUNITY)
Admission: RE | Disposition: A | Discharge status: Home / Self Care | Payer: Self-pay | Source: Home / Self Care | Attending: Obstetrics and Gynecology

## 2020-07-02 ENCOUNTER — Other Ambulatory Visit: Payer: Self-pay

## 2020-07-02 DIAGNOSIS — Z3A37 37 weeks gestation of pregnancy: Secondary | ICD-10-CM | POA: Diagnosis not present

## 2020-07-02 DIAGNOSIS — Z98891 History of uterine scar from previous surgery: Secondary | ICD-10-CM

## 2020-07-02 DIAGNOSIS — O9832 Other infections with a predominantly sexual mode of transmission complicating childbirth: Secondary | ICD-10-CM | POA: Diagnosis present

## 2020-07-02 DIAGNOSIS — O34212 Maternal care for vertical scar from previous cesarean delivery: Secondary | ICD-10-CM | POA: Diagnosis present

## 2020-07-02 DIAGNOSIS — A6000 Herpesviral infection of urogenital system, unspecified: Secondary | ICD-10-CM | POA: Diagnosis present

## 2020-07-02 DIAGNOSIS — O99824 Streptococcus B carrier state complicating childbirth: Secondary | ICD-10-CM | POA: Diagnosis present

## 2020-07-02 DIAGNOSIS — B009 Herpesviral infection, unspecified: Secondary | ICD-10-CM | POA: Diagnosis not present

## 2020-07-02 DIAGNOSIS — O3433 Maternal care for cervical incompetence, third trimester: Secondary | ICD-10-CM | POA: Diagnosis present

## 2020-07-02 DIAGNOSIS — O34219 Maternal care for unspecified type scar from previous cesarean delivery: Secondary | ICD-10-CM | POA: Diagnosis present

## 2020-07-02 HISTORY — PX: CERVICAL CERCLAGE: SHX1329

## 2020-07-02 SURGERY — Surgical Case
Anesthesia: Spinal | Wound class: Clean

## 2020-07-02 MED ORDER — COCONUT OIL OIL: 1.0000 "application " | TOPICAL_OIL | Status: DC | PRN

## 2020-07-02 MED ORDER — SIMETHICONE 80 MG PO CHEW
80.0000 mg | CHEWABLE_TABLET | ORAL | Status: DC | PRN
  Filled 2020-07-02: qty 1

## 2020-07-02 MED ORDER — NALBUPHINE HCL 10 MG/ML IJ SOLN
5.0000 mg | Freq: Once | INTRAMUSCULAR | Status: AC | PRN
Start: 2020-07-02 — End: 2020-07-02
  Administered 2020-07-02: 5 mg via INTRAVENOUS

## 2020-07-02 MED ORDER — ONDANSETRON HCL 4 MG/2ML IJ SOLN
4.0000 mg | Freq: Three times a day (TID) | INTRAMUSCULAR | Status: DC | PRN
  Administered 2020-07-02: 13:00:00 4 mg via INTRAVENOUS

## 2020-07-02 MED ORDER — OXYTOCIN-SODIUM CHLORIDE 30-0.9 UT/500ML-% IV SOLN
2.5000 [IU]/h | INTRAVENOUS | Status: AC
  Administered 2020-07-02: 16:00:00 2.5 [IU]/h via INTRAVENOUS
  Filled 2020-07-02: qty 500

## 2020-07-02 MED ORDER — KETOROLAC TROMETHAMINE 30 MG/ML IJ SOLN: 30.0000 mg | Freq: Four times a day (QID) | INTRAMUSCULAR | Status: AC | PRN

## 2020-07-02 MED ORDER — SODIUM CHLORIDE 0.9 % IR SOLN
Status: DC | PRN
  Administered 2020-07-02 (×2): 1

## 2020-07-02 MED ORDER — NALBUPHINE HCL 10 MG/ML IJ SOLN
INTRAMUSCULAR | Status: AC
  Filled 2020-07-02: qty 1

## 2020-07-02 MED ORDER — FENTANYL CITRATE (PF) 100 MCG/2ML IJ SOLN
INTRAMUSCULAR | Status: DC | PRN
  Administered 2020-07-02: 85 ug via INTRAVENOUS

## 2020-07-02 MED ORDER — TRANEXAMIC ACID-NACL 1000-0.7 MG/100ML-% IV SOLN
INTRAVENOUS | Status: AC
  Filled 2020-07-02: qty 100

## 2020-07-02 MED ORDER — NALBUPHINE HCL 10 MG/ML IJ SOLN: 5.0000 mg | Freq: Once | INTRAMUSCULAR | Status: AC | PRN

## 2020-07-02 MED ORDER — SENNOSIDES-DOCUSATE SODIUM 8.6-50 MG PO TABS
2.0000 | ORAL_TABLET | Freq: Every day | ORAL | Status: DC
  Administered 2020-07-03 – 2020-07-04 (×2): 2 via ORAL
  Filled 2020-07-02 (×2): qty 2

## 2020-07-02 MED ORDER — POVIDONE-IODINE 10 % EX SWAB
2.0000 "application " | Freq: Once | CUTANEOUS | Status: AC
  Administered 2020-07-02: 2 via TOPICAL

## 2020-07-02 MED ORDER — DIBUCAINE (PERIANAL) 1 % EX OINT: 1.0000 "application " | TOPICAL_OINTMENT | CUTANEOUS | Status: DC | PRN

## 2020-07-02 MED ORDER — DIPHENHYDRAMINE HCL 25 MG PO CAPS: 25.0000 mg | ORAL_CAPSULE | ORAL | Status: DC | PRN

## 2020-07-02 MED ORDER — MORPHINE SULFATE (PF) 0.5 MG/ML IJ SOLN
INTRAMUSCULAR | Status: DC | PRN
  Administered 2020-07-02: .15 mg via INTRATHECAL

## 2020-07-02 MED ORDER — OXYTOCIN-SODIUM CHLORIDE 30-0.9 UT/500ML-% IV SOLN
INTRAVENOUS | Status: DC | PRN
  Administered 2020-07-02: 200 m[IU]/min via INTRAVENOUS

## 2020-07-02 MED ORDER — SODIUM CHLORIDE 0.9% FLUSH: 3.0000 mL | INTRAVENOUS | Status: DC | PRN

## 2020-07-02 MED ORDER — SOD CITRATE-CITRIC ACID 500-334 MG/5ML PO SOLN: 30.0000 mL | ORAL | Status: DC

## 2020-07-02 MED ORDER — OXYTOCIN-SODIUM CHLORIDE 30-0.9 UT/500ML-% IV SOLN
INTRAVENOUS | Status: AC
  Filled 2020-07-02: qty 500

## 2020-07-02 MED ORDER — NALBUPHINE HCL 10 MG/ML IJ SOLN: 5.0000 mg | INTRAMUSCULAR | Status: DC | PRN

## 2020-07-02 MED ORDER — WITCH HAZEL-GLYCERIN EX PADS: 1.0000 "application " | MEDICATED_PAD | CUTANEOUS | Status: DC | PRN

## 2020-07-02 MED ORDER — DEXAMETHASONE SODIUM PHOSPHATE 4 MG/ML IJ SOLN
INTRAMUSCULAR | Status: DC | PRN
  Administered 2020-07-02: 10 mg via INTRAVENOUS

## 2020-07-02 MED ORDER — ACETAMINOPHEN 500 MG PO TABS: 1000.0000 mg | ORAL_TABLET | Freq: Four times a day (QID) | ORAL | Status: DC

## 2020-07-02 MED ORDER — PRENATAL MULTIVITAMIN CH
1.0000 | ORAL_TABLET | Freq: Every day | ORAL | Status: DC
  Administered 2020-07-02 – 2020-07-04 (×3): 1 via ORAL
  Filled 2020-07-02 (×3): qty 1

## 2020-07-02 MED ORDER — FENTANYL CITRATE (PF) 100 MCG/2ML IJ SOLN: 25.0000 ug | INTRAMUSCULAR | Status: DC | PRN

## 2020-07-02 MED ORDER — PHENYLEPHRINE HCL-NACL 20-0.9 MG/250ML-% IV SOLN
INTRAVENOUS | Status: AC
  Filled 2020-07-02: qty 250

## 2020-07-02 MED ORDER — LACTATED RINGERS IV SOLN: INTRAVENOUS | Status: DC

## 2020-07-02 MED ORDER — MORPHINE SULFATE (PF) 0.5 MG/ML IJ SOLN
INTRAMUSCULAR | Status: AC
  Filled 2020-07-02: qty 10

## 2020-07-02 MED ORDER — KETOROLAC TROMETHAMINE 30 MG/ML IJ SOLN
30.0000 mg | Freq: Once | INTRAMUSCULAR | Status: AC | PRN
  Administered 2020-07-02: 30 mg via INTRAVENOUS

## 2020-07-02 MED ORDER — FENTANYL CITRATE (PF) 100 MCG/2ML IJ SOLN
INTRAMUSCULAR | Status: DC | PRN
  Administered 2020-07-02: 10:00:00 15 ug via INTRATHECAL

## 2020-07-02 MED ORDER — ACETAMINOPHEN 500 MG PO TABS
1000.0000 mg | ORAL_TABLET | Freq: Four times a day (QID) | ORAL | Status: DC
  Administered 2020-07-02 – 2020-07-04 (×8): 1000 mg via ORAL
  Filled 2020-07-02 (×8): qty 2

## 2020-07-02 MED ORDER — SCOPOLAMINE 1 MG/3DAYS TD PT72
MEDICATED_PATCH | TRANSDERMAL | Status: AC
  Filled 2020-07-02: qty 1

## 2020-07-02 MED ORDER — ZOLPIDEM TARTRATE 5 MG PO TABS: 5.0000 mg | ORAL_TABLET | Freq: Every evening | ORAL | Status: DC | PRN

## 2020-07-02 MED ORDER — BUPIVACAINE IN DEXTROSE 0.75-8.25 % IT SOLN
INTRATHECAL | Status: DC | PRN
  Administered 2020-07-02: 1.8 mg via INTRATHECAL

## 2020-07-02 MED ORDER — DIPHENHYDRAMINE HCL 25 MG PO CAPS
25.0000 mg | ORAL_CAPSULE | Freq: Four times a day (QID) | ORAL | Status: DC | PRN
Start: 2020-07-02 — End: 2020-07-04

## 2020-07-02 MED ORDER — NALOXONE HCL 0.4 MG/ML IJ SOLN: 0.4000 mg | INTRAMUSCULAR | Status: DC | PRN

## 2020-07-02 MED ORDER — PHENYLEPHRINE HCL-NACL 20-0.9 MG/250ML-% IV SOLN
INTRAVENOUS | Status: DC | PRN
  Administered 2020-07-02: 60 ug/min via INTRAVENOUS

## 2020-07-02 MED ORDER — DEXTROSE 5 % IV SOLN
3.0000 g | INTRAVENOUS | Status: AC
  Administered 2020-07-02: 10:00:00 3 g via INTRAVENOUS

## 2020-07-02 MED ORDER — MENTHOL 3 MG MT LOZG: 1.0000 | LOZENGE | OROMUCOSAL | Status: DC | PRN

## 2020-07-02 MED ORDER — DEXTROSE 5 % IV SOLN: 3.0000 g | INTRAVENOUS | Status: DC

## 2020-07-02 MED ORDER — SIMETHICONE 80 MG PO CHEW
80.0000 mg | CHEWABLE_TABLET | Freq: Three times a day (TID) | ORAL | Status: DC
  Administered 2020-07-02 – 2020-07-04 (×7): 80 mg via ORAL
  Filled 2020-07-02 (×6): qty 1

## 2020-07-02 MED ORDER — OXYCODONE HCL 5 MG PO TABS
5.0000 mg | ORAL_TABLET | ORAL | Status: DC | PRN
  Administered 2020-07-03 – 2020-07-04 (×2): 5 mg via ORAL
  Filled 2020-07-02 (×2): qty 1

## 2020-07-02 MED ORDER — KETOROLAC TROMETHAMINE 30 MG/ML IJ SOLN
30.0000 mg | Freq: Four times a day (QID) | INTRAMUSCULAR | Status: AC
  Filled 2020-07-02: qty 1

## 2020-07-02 MED ORDER — ONDANSETRON HCL 4 MG/2ML IJ SOLN: 4.0000 mg | Freq: Once | INTRAMUSCULAR | Status: DC

## 2020-07-02 MED ORDER — NALOXONE HCL 4 MG/10ML IJ SOLN
1.0000 ug/kg/h | INTRAVENOUS | Status: DC | PRN
  Filled 2020-07-02: qty 5

## 2020-07-02 MED ORDER — DEXTROSE 5 % IV SOLN
INTRAVENOUS | Status: AC
  Filled 2020-07-02: qty 3000

## 2020-07-02 MED ORDER — TRANEXAMIC ACID-NACL 1000-0.7 MG/100ML-% IV SOLN
INTRAVENOUS | Status: DC | PRN
  Administered 2020-07-02: 1000 mg via INTRAVENOUS

## 2020-07-02 MED ORDER — DIPHENHYDRAMINE HCL 50 MG/ML IJ SOLN
INTRAMUSCULAR | Status: AC
  Filled 2020-07-02: qty 1

## 2020-07-02 MED ORDER — TETANUS-DIPHTH-ACELL PERTUSSIS 5-2.5-18.5 LF-MCG/0.5 IM SUSY: 0.5000 mL | PREFILLED_SYRINGE | Freq: Once | INTRAMUSCULAR | Status: DC

## 2020-07-02 MED ORDER — KETOROLAC TROMETHAMINE 30 MG/ML IJ SOLN
INTRAMUSCULAR | Status: AC
  Filled 2020-07-02: qty 1

## 2020-07-02 MED ORDER — SCOPOLAMINE 1 MG/3DAYS TD PT72
1.0000 | MEDICATED_PATCH | Freq: Once | TRANSDERMAL | Status: DC
  Administered 2020-07-02: 1.5 mg via TRANSDERMAL

## 2020-07-02 MED ORDER — ONDANSETRON HCL 4 MG/2ML IJ SOLN
INTRAMUSCULAR | Status: AC
  Filled 2020-07-02: qty 2

## 2020-07-02 MED ORDER — IBUPROFEN 800 MG PO TABS
800.0000 mg | ORAL_TABLET | Freq: Four times a day (QID) | ORAL | Status: DC
  Administered 2020-07-03 – 2020-07-04 (×4): 800 mg via ORAL
  Filled 2020-07-02 (×5): qty 1

## 2020-07-02 MED ORDER — ENOXAPARIN SODIUM 40 MG/0.4ML ~~LOC~~ SOLN
40.0000 mg | SUBCUTANEOUS | Status: DC
  Filled 2020-07-02: qty 0.4

## 2020-07-02 MED ORDER — ONDANSETRON HCL 4 MG/2ML IJ SOLN
INTRAMUSCULAR | Status: DC | PRN
  Administered 2020-07-02: 4 mg via INTRAVENOUS

## 2020-07-02 MED ORDER — DIPHENHYDRAMINE HCL 50 MG/ML IJ SOLN
INTRAMUSCULAR | Status: DC | PRN
  Administered 2020-07-02: 25 mg via INTRAVENOUS

## 2020-07-02 MED ORDER — FENTANYL CITRATE (PF) 100 MCG/2ML IJ SOLN
INTRAMUSCULAR | Status: AC
  Filled 2020-07-02: qty 2

## 2020-07-02 MED ORDER — DIPHENHYDRAMINE HCL 50 MG/ML IJ SOLN: 12.5000 mg | INTRAMUSCULAR | Status: DC | PRN

## 2020-07-02 MED ORDER — DEXAMETHASONE SODIUM PHOSPHATE 10 MG/ML IJ SOLN
INTRAMUSCULAR | Status: AC
  Filled 2020-07-02: qty 1

## 2020-07-02 MED ORDER — LACTATED RINGERS IV SOLN: INTRAVENOUS | Status: DC | PRN

## 2020-07-02 SURGICAL SUPPLY — 46 items
BARRIER ADHS 3X4 INTERCEED (GAUZE/BANDAGES/DRESSINGS) ×3 IMPLANT
BENZOIN TINCTURE PRP APPL 2/3 (GAUZE/BANDAGES/DRESSINGS) ×3 IMPLANT
CHLORAPREP W/TINT 26ML (MISCELLANEOUS) ×3 IMPLANT
CLAMP CORD UMBIL (MISCELLANEOUS) IMPLANT
CLOSURE STERI STRIP 1/2 X4 (GAUZE/BANDAGES/DRESSINGS) ×3 IMPLANT
CLOTH BEACON ORANGE TIMEOUT ST (SAFETY) ×3 IMPLANT
DRSG OPSITE POSTOP 4X10 (GAUZE/BANDAGES/DRESSINGS) ×3 IMPLANT
ELECT REM PT RETURN 9FT ADLT (ELECTROSURGICAL) ×3
ELECTRODE REM PT RTRN 9FT ADLT (ELECTROSURGICAL) ×2 IMPLANT
EXTRACTOR VACUUM M CUP 4 TUBE (SUCTIONS) IMPLANT
GAUZE SPONGE 4X4 12PLY STRL LF (GAUZE/BANDAGES/DRESSINGS) ×6 IMPLANT
GLOVE BIO SURGEON STRL SZ7.5 (GLOVE) ×3 IMPLANT
GLOVE BIOGEL PI IND STRL 7.0 (GLOVE) ×2 IMPLANT
GLOVE BIOGEL PI IND STRL 7.5 (GLOVE) ×2 IMPLANT
GLOVE BIOGEL PI INDICATOR 7.0 (GLOVE) ×1
GLOVE BIOGEL PI INDICATOR 7.5 (GLOVE) ×1
GOWN STRL REUS W/TWL LRG LVL3 (GOWN DISPOSABLE) ×6 IMPLANT
HEMOSTAT ARISTA ABSORB 3G PWDR (HEMOSTASIS) ×3 IMPLANT
HOVERMATT SINGLE USE (MISCELLANEOUS) ×3 IMPLANT
KIT ABG SYR 3ML LUER SLIP (SYRINGE) IMPLANT
NEEDLE HYPO 25X5/8 SAFETYGLIDE (NEEDLE) IMPLANT
NS IRRIG 1000ML POUR BTL (IV SOLUTION) ×3 IMPLANT
PACK C SECTION WH (CUSTOM PROCEDURE TRAY) ×3 IMPLANT
PAD ABD 7.5X8 STRL (GAUZE/BANDAGES/DRESSINGS) ×9 IMPLANT
PAD OB MATERNITY 4.3X12.25 (PERSONAL CARE ITEMS) ×3 IMPLANT
PENCIL SMOKE EVAC W/HOLSTER (ELECTROSURGICAL) ×3 IMPLANT
RETRACTOR TRAXI PANNICULUS (MISCELLANEOUS) ×2 IMPLANT
RTRCTR C-SECT PINK 25CM LRG (MISCELLANEOUS) ×3 IMPLANT
SPONGE LAP 18X18 RF (DISPOSABLE) ×6 IMPLANT
STRIP CLOSURE SKIN 1/2X4 (GAUZE/BANDAGES/DRESSINGS) ×3 IMPLANT
STRIP CLOSURE SKIN 1/4X4 (GAUZE/BANDAGES/DRESSINGS) ×3 IMPLANT
SUT CHROMIC 2 0 CT 1 (SUTURE) ×3 IMPLANT
SUT MNCRL AB 3-0 PS2 27 (SUTURE) ×3 IMPLANT
SUT PLAIN 2 0 XLH (SUTURE) ×3 IMPLANT
SUT VIC AB 0 CT1 36 (SUTURE) ×3 IMPLANT
SUT VIC AB 0 CTX 36 (SUTURE) ×3
SUT VIC AB 0 CTX36XBRD ANBCTRL (SUTURE) ×6 IMPLANT
SUT VIC AB 2-0 CT1 27 (SUTURE) ×2
SUT VIC AB 2-0 CT1 TAPERPNT 27 (SUTURE) ×4 IMPLANT
SUT VIC AB 2-0 CTX 36 (SUTURE) ×3 IMPLANT
SUT VIC AB 2-0 SH 27 (SUTURE) ×2
SUT VIC AB 2-0 SH 27XBRD (SUTURE) ×4 IMPLANT
TOWEL OR 17X24 6PK STRL BLUE (TOWEL DISPOSABLE) ×3 IMPLANT
TRAXI PANNICULUS RETRACTOR (MISCELLANEOUS) ×1
TRAY FOLEY W/BAG SLVR 14FR LF (SET/KITS/TRAYS/PACK) ×3 IMPLANT
WATER STERILE IRR 1000ML POUR (IV SOLUTION) ×3 IMPLANT

## 2020-07-02 NOTE — Anesthesia Postprocedure Evaluation (Signed)
Anesthesia Post Note  Patient: Engineer, building services  Procedure(s) Performed: CESAREAN SECTION (N/A ) APPLICATION OF CELL SAVER (N/A ) CERCLAGE CERVICAL REMOVAL     Patient location during evaluation: PACU Anesthesia Type: Spinal Level of consciousness: oriented and awake and alert Pain management: pain level controlled Vital Signs Assessment: post-procedure vital signs reviewed and stable Respiratory status: spontaneous breathing, respiratory function stable and patient connected to nasal cannula oxygen Cardiovascular status: blood pressure returned to baseline and stable Postop Assessment: no headache, no backache and no apparent nausea or vomiting Anesthetic complications: no   No complications documented.  Last Vitals:  Vitals:   07/02/20 1328 07/02/20 1429  BP: 120/73 109/69  Pulse: 78 68  Resp: 20 18  Temp: 36.6 C (!) 36.4 C  SpO2: 96% 98%    Last Pain:  Vitals:   07/02/20 1429  TempSrc: Oral  PainSc: 0-No pain   Pain Goal:                   Emily Valenzuela

## 2020-07-02 NOTE — Transfer of Care (Signed)
Immediate Anesthesia Transfer of Care Note  Patient: Emily Valenzuela  Procedure(s) Performed: CESAREAN SECTION (N/A ) APPLICATION OF CELL SAVER (N/A )  Patient Location: PACU  Anesthesia Type:Spinal  Level of Consciousness: awake, alert , oriented and patient cooperative  Airway & Oxygen Therapy: Patient Spontanous Breathing  Post-op Assessment: Report given to RN, Post -op Vital signs reviewed and stable and Patient moving all extremities X 4  Post vital signs: Reviewed and stable  Last Vitals:  Vitals Value Taken Time  BP 112/70 07/02/20 1216  Temp    Pulse 99 07/02/20 1221  Resp 31 07/02/20 1221  SpO2 100 % 07/02/20 1221  Vitals shown include unvalidated device data.  Last Pain:  Vitals:   07/02/20 1215  TempSrc:   PainSc: 0-No pain         Complications: No complications documented.

## 2020-07-02 NOTE — Anesthesia Procedure Notes (Signed)
Spinal  Patient location during procedure: OR Start time: 07/02/2020 9:55 AM End time: 07/02/2020 10:05 AM Staffing Performed: anesthesiologist  Anesthesiologist: Elmer Picker, MD Preanesthetic Checklist Completed: patient identified, IV checked, risks and benefits discussed, surgical consent, monitors and equipment checked, pre-op evaluation and timeout performed Spinal Block Patient position: sitting Prep: DuraPrep and site prepped and draped Patient monitoring: cardiac monitor, continuous pulse ox and blood pressure Approach: midline Location: L3-4 Injection technique: single-shot Needle Needle type: Pencan and Tuohy  Needle gauge: 25 G Needle length: 9 cm Assessment Sensory level: T6 Additional Notes Functioning IV was confirmed and monitors were applied. Sterile prep and drape, including hand hygiene and sterile gloves were used. The patient was positioned and the spine was prepped. The skin was anesthetized with lidocaine.  Free flow of clear CSF was obtained prior to injecting local anesthetic into the CSF.  The spinal needle aspirated freely following injection.  The needle was carefully withdrawn.  The patient tolerated the procedure well.

## 2020-07-02 NOTE — Op Note (Addendum)
Cesarean Section Procedure Note  Indications: P0 at 37wks presenting for repeat c-section, d/t h/o classical c-section at 24wks with neonatal death at almost 34yo, and removal of cerclage.  Pre-operative Diagnosis: 1.37wks 2.History of Classical Cesarean Section 3.Incompetent Cervix   Post-operative Diagnosis: 1.37wks 2.History of Classical Cesarean Section 3.Incompetent cervix  Procedure: 1.REPEAT CESAREAN SECTION 2.REMOVAL OF CERCLAGE 3.LOA  Surgeon: Osborn Coho, MD    Assistants: Rhea Pink, CNM  Anesthesia: Spinal  Anesthesiologist: Willette Alma, MD   Procedure Details  The patient was taken to the operating room secondary to h/o c-section and incompetent cervix with cerclage in place after the risks, benefits, complications, treatment options, and expected outcomes were discussed with the patient.  The patient concurred with the proposed plan, giving informed consent which was signed and witnessed. The patient was taken to Operating Room B, identified as [facility] and the procedure verified as C-Section Delivery. A Time Out was held and the above information confirmed.  After induction of anesthesia by obtaining a spinal, the patient was prepped and draped in the usual sterile manner. A Pfannenstiel skin incision was made and carried down through the subcutaneous tissue to the underlying layer of fascia.  The fascia was incised bilaterally and extended transversely bilaterally with the Mayo scissors. Kocher clamps were placed on the inferior aspect of the fascial incision and the underlying rectus muscle was separated from the fascia. The same was done on the superior aspect of the fascial incision.  The peritoneum was identified, entered bluntly and extended manually.  An Alexis self-retaining retractor was placed.  The utero-vesical peritoneal reflection was incised transversely and the bladder flap was bluntly freed from the lower uterine segment. A low transverse  uterine incision was made with the scalpel and extended bilaterally with the bandage scissors.  The infant was delivered in vertex position without difficulty.  After the umbilical cord was clamped and cut, the infant was handed to the awaiting pediatricians.  Cord blood was obtained for evaluation.  The placenta was removed intact and appeared to be within normal limits. The uterus was cleared of all clots and debris. The uterine incision was closed with running interlocking sutures of 0 Vicryl and a second imbricating layer was performed as well.   Omental adhesions were removed from the anterior wall of the uterus and extended from the lower uterine segment up to the fundus.  Persistent oozing at the uterine incision was made hemostatic with several stitches of 0-vicryl.  Intercede was placed along the anterior uterine wall and Arista placed on the uterine incision.  Good hemostasis was noted.  Bilateral tubes and ovaries appeared to be within normal limits.  Copious irrigation was performed until clear.  The peritoneum was repaired with 2-0 chromic via a running suture.  The fascia was reapproximated with a running suture of 0 Vicryl. The subcutaneous tissue was reapproximated with 3 interrupted sutures of 2-0 plain.  The skin was reapproximated with a subcuticular suture of 3-0 monocryl.  Steristrips were applied.  The patient was placed in stirrups and a graves speculum placed in the vagina.  A ringed forcep was used to grasp the anterior lip of the cervix which allowed for visualization of the cerclage that appeared to have been placed with proline.  The stitch was grasped with a kelly clamp and stitch cut below the knot and stitch removed without difficulty.  Minimal bleeding noted.  Instrument, sponge, and needle counts were correct prior to abdominal closure and at the conclusion of  the case.  The patient was awaiting transfer to the recovery room in good condition.  Findings: Live female infant with  Apgars 8 at one minute and 8 at five minutes.  Normal appearing bilateral ovaries and fallopian tubes were noted.  Estimated Blood Loss:  712 ml         Drains: foley to gravity 400 cc         Total IV Fluids: 2400 ml         Specimens to Pathology: none         Complications:  None; patient tolerated the procedure well.         Disposition: PACU - hemodynamically stable.         Condition: stable  Attending Attestation: I performed the procedure.

## 2020-07-02 NOTE — Anesthesia Preprocedure Evaluation (Signed)
Anesthesia Evaluation    Reviewed: Allergy & Precautions, Patient's Chart, lab work & pertinent test results  Airway Mallampati: II  TM Distance: >3 FB Neck ROM: Full    Dental no notable dental hx.    Pulmonary neg pulmonary ROS,    Pulmonary exam normal breath sounds clear to auscultation       Cardiovascular negative cardio ROS Normal cardiovascular exam Rhythm:Regular Rate:Normal     Neuro/Psych negative neurological ROS  negative psych ROS   GI/Hepatic negative GI ROS, Neg liver ROS,   Endo/Other  Morbid obesity (BMI 48)  Renal/GU negative Renal ROS  negative genitourinary   Musculoskeletal negative musculoskeletal ROS (+)   Abdominal   Peds  Hematology negative hematology ROS (+)   Anesthesia Other Findings   Reproductive/Obstetrics (+) Pregnancy                             Anesthesia Physical Anesthesia Plan  ASA: III  Anesthesia Plan: Spinal   Post-op Pain Management:    Induction:   PONV Risk Score and Plan: Treatment may vary due to age or medical condition  Airway Management Planned: Natural Airway  Additional Equipment:   Intra-op Plan:   Post-operative Plan:   Informed Consent: I have reviewed the patients History and Physical, chart, labs and discussed the procedure including the risks, benefits and alternatives for the proposed anesthesia with the patient or authorized representative who has indicated his/her understanding and acceptance.     Dental advisory given  Plan Discussed with: CRNA  Anesthesia Plan Comments:         Anesthesia Quick Evaluation

## 2020-07-02 NOTE — Lactation Note (Signed)
This note was copied from a baby's chart. Lactation Consultation Note  Patient Name: Emily Valenzuela Date: 07/02/2020 Reason for consult: Initial assessment;Early term 37-38.6wks;Infant < 6lbs;Difficult latch Age:34 hours Mom sitting in bed pumping with DEBP, ~14ml total noted from session, maternal grandmother holding sleeping baby, maternal aunt sitting on couch. Mom states baby has not latched to breast, received formula ~2pm, mom prefers to use DBM. Mom reports h/o BF x60mo (84yrs ago, child passed away at age 5), mom reports pumping first few months d/t baby being in the NICU. Mom reports plans to exclusively BF, mom has Spectra DEBP at home.   LC assisted with latching, baby without cues at this time, placed baby skin to skin on mom's chest. Discussed cue based feedings, wake if >3hrs since last feeding, keep total length of feeding to 30 mins or less d/t infant wt, optimal skin to skin. Mom reviewed and signed DBM consent.   Baby now with cues, attempted to latch to left breast football and cross cradle, unsuccessful. Mom with short everted nipples bilat, 12mm nipple shield applied, baby latched nursed ~45min left side, ~50min right side, few audible swallows noted. LC gave 60ml DBM via curved tip syringe and gloved finger (DBM Lot# [id number]), baby tolerated well and fell asleep. Reinforced cleaning of parts, pump frequency, and milk storage guidelines. Mom voiced understanding and with no further concerns. Left the room with mom changing baby's wet diaper. BGilliam, RN, IBCLC  Plan - cue based feedings, wake if >3hrs since last feeding - expect 8-12 feedings in 24hrs - use nipple shield with feedings (will re-evaluate use on f/u visit) - use DEBP/ hand expression after each feeding and offer back to baby - supplement with DBM - call for LC if needed for latch support   Maternal Data Formula Feeding for Exclusion: No Has patient been taught Hand Expression?: Yes Does the patient  have breastfeeding experience prior to this delivery?: Yes  Feeding Feeding Type: Breast Fed Nipple Type: Slow - flow  LATCH Score Latch: Grasps breast easily, tongue down, lips flanged, rhythmical sucking.  Audible Swallowing: A few with stimulation  Type of Nipple: Everted at rest and after stimulation (short)  Comfort (Breast/Nipple): Soft / non-tender  Hold (Positioning): Assistance needed to correctly position infant at breast and maintain latch.  LATCH Score: 8  Interventions Interventions: Breast feeding basics reviewed;Skin to skin;Hand express;DEBP  Lactation Tools Discussed/Used Tools: Nipple Shields Nipple shield size: 16 WIC Program: No Pump Education: Milk Storage;Setup, frequency, and cleaning Initiated by:: Malachi Bonds RN Date initiated:: 07/02/20   Consult Status Consult Status: Follow-up Date: 07/03/20 Follow-up type: In-patient    Emily Valenzuela 07/02/2020, 5:47 PM

## 2020-07-02 NOTE — H&P (Addendum)
OB ADMISSION/ HISTORY & PHYSICAL:  Admission Date: 07/02/2020  7:24 AM  Admit Diagnosis: Repeat C/S, Hx of classical C/S  Emily Valenzuela is a 34 y.o. female [phone] [redacted]w[redacted]d presenting for repeat cesarean section. Hx of classical uterine incision at 27 wks, infant died after birth. Prophylactic cerclage this pregnancy and 17P. Poor adherence to 17P R/T insurance issues and subsequent med costs.   History of current pregnancy: G4P0120   Patient entered care with [facility] at 12 wks.   EDC by LMP and congruent w/ 7+1 wk U/S.   Anatomy scan:  complete w/ posterior placenta.    Last evaluation: 36 wks BPP 8/8 in 10 min - lagging BPD <2% HC <2% - vertex/posterior placenta/ AFI 15.5/ EFW 5# 8oz 18%  Significant prenatal events:  Patient Active Problem List   Diagnosis Date Noted  . History of cesarean section, classical 07/02/2020  . 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 Cervical incompetence 02/25/2020    Prenatal Labs: ABO, Rh: --/--/O POS (12/27 1035) Antibody: NEG (12/27 1035) Rubella:   immune RPR: NON REACTIVE (12/27 1032)  HBsAg:   NR HIV:   NR GTT: passed 1 hr GBS:   positive GC/CHL: neg/neg Genetics: low-risk Tdap/influenza vaccines: both UTD   OB History  Gravida Para Term Preterm AB Living  4 1   1 2  0  SAB IAB Ectopic Multiple Live Births  1 1     1     # Outcome Date GA Lbr Len/2nd Weight Sex Delivery Anes PTL Lv  4 Current           3 Preterm      CS-LTranv   ND  2 SAB           1 IAB             Medical / Surgical History: Past medical history:  Past Medical History:  Diagnosis Date  . BV (bacterial vaginosis)   . Cervical incompetence   . 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 HSV-2 infection   . Ovarian cyst   . Trichimoniasis     Past surgical history:  Past Surgical History:  Procedure Laterality Date  . CERVICAL CERCLAGE N/A 02/25/2020   Procedure: CERCLAGE CERVICAL;  Surgeon: , MD;  Location: [facility] LD ORS;  Service:  Gynecology;  Laterality: N/A;  . CESAREAN SECTION    . THERAPEUTIC ABORTION    . WISDOM TOOTH EXTRACTION     Family History: No family history on file.  Social History:  reports that she has never smoked. She has never used smokeless tobacco. She reports previous alcohol use. She reports that she does not use drugs.  Allergies: Patient has no known allergies.   Current Medications at time of admission:  Prior to Admission medications   Medication Sig Start Date End Date Taking? Authorizing Provider  Prenatal Vit-Fe Fumarate-FA (PRENATAL MULTIVITAMIN) TABS tablet Take 1 tablet by mouth daily at 12 noon.   Yes [provider]  valACYclovir (VALTREX) 500 MG tablet Take 500 mg by mouth 2 (two) times daily.   Yes [provider]    Review of Systems: Constitutional: Negative   HENT: Negative   Eyes: Negative   Respiratory: Negative   Cardiovascular: Negative   Gastrointestinal: Negative  Genitourinary: neg for bloody show, neg for LOF   Musculoskeletal: Negative   Skin: Negative   Neurological: Negative   Endo/Heme/Allergies: Negative   Psychiatric/Behavioral: Negative    Physical Exam:  VS: Height 5\' 4"  (1.626 m), weight 127 kg, last menstrual period 10/17/2019. AAO x3, no signs of distress Cardiovascular: RRR Respiratory: Lung fields clear to ausculation GU/GI: Abdomen gravid, non-tender, non-distended, active FM Extremities: trace edema to BLE, negative for pain, tenderness, and cords  Cervical exam: deferred FHR doppler: 128 TOCO: not applied   Prenatal Transfer Tool  Maternal Diabetes: No Genetic Screening: Normal Maternal Ultrasounds/Referrals: Normal Fetal Ultrasounds or other Referrals:  None Maternal Substance Abuse:  No Significant Maternal Medications:  Meds include: Other: Valtrex Significant Maternal Lab Results: Group B Strep positive    Assessment: 34 y.o. 20 [redacted]w[redacted]d  Repeat C-Section, hx of classical uterine scar HSV2     -denies prodromal S/S    -Valtrex since 35 wks GBS positive Pain management plan: per anesthesia   Plan:  Admit to L&D OR Routine pre-op orders  Dr [redacted]w[redacted]d notified of admission and plan of care  Su Hilt MSN, CNM 07/02/2020 2:33 AM  Risks benefits alternatives reviewed with the patient including but not limited to bleeding infection and injury.  Questions answered and consent signed and witnessed.  Planned repeat c-section with removal of cerclage at 37wks d/t h/o classical c-section.

## 2020-07-03 ENCOUNTER — Encounter (HOSPITAL_COMMUNITY): Payer: Self-pay | Admitting: Obstetrics and Gynecology

## 2020-07-03 LAB — CBC
HCT: 29.5 % — ABNORMAL LOW (ref 36.0–46.0)
Hemoglobin: 10.1 g/dL — ABNORMAL LOW (ref 12.0–15.0)
MCH: 26.9 pg (ref 26.0–34.0)
MCHC: 34.2 g/dL (ref 30.0–36.0)
MCV: 78.5 fL — ABNORMAL LOW (ref 80.0–100.0)
Platelets: 288 10*3/uL (ref 150–400)
RBC: 3.76 MIL/uL — ABNORMAL LOW (ref 3.87–5.11)
RDW: 16.3 % — ABNORMAL HIGH (ref 11.5–15.5)
WBC: 11.1 10*3/uL — ABNORMAL HIGH (ref 4.0–10.5)
nRBC: 0 % (ref 0.0–0.2)

## 2020-07-03 LAB — BASIC METABOLIC PANEL
Anion gap: 10 (ref 5–15)
BUN: <5 mg/dL — ABNORMAL LOW (ref 6–20)
CO2: 21 mmol/L — ABNORMAL LOW (ref 22–32)
Calcium: 9.1 mg/dL (ref 8.9–10.3)
Chloride: 103 mmol/L (ref 98–111)
Creatinine, Ser: 0.7 mg/dL (ref 0.44–1.00)
GFR, Estimated: >60 mL/min (ref >60)
Glucose, Bld: 106 mg/dL — ABNORMAL HIGH (ref 70–99)
Potassium: 3.7 mmol/L (ref 3.5–5.1)
Sodium: 134 mmol/L — ABNORMAL LOW (ref 135–145)

## 2020-07-03 NOTE — Progress Notes (Signed)
Patient showered earlier this evening. Pressure dressing removed, moderate amount of drainage noted to honeycomb dressing. Spoke with Verdis Prime, CNM regarding dressing, ok to replace.

## 2020-07-03 NOTE — Lactation Note (Signed)
This note was copied from a baby's chart. Lactation Consultation Note  Patient Name: Emily Valenzuela [id number] Date: 07/03/2020 Reason for consult: Follow-up assessment;Early term 37-38.6wks Age:34 hours  Family changing baby grates diaper on arrival.  Mom reports baby Emily Aldape is still not breastfeeding well and she is not getting anything with pumping. Explained to mom that could be very normal because of his size and gestational age.  Mom has small amounts of colostrum in the bottle. Urged mom to continue to offer the breast first without and then with the  Nipple shield and then pump.  Urged mom to feed back all expressed mothers milk prior to giving formula Mom has DEBP for home use.  Urged her to continue to feed on cue and 8-12 or more times a day.  Pump every 3 hours/try to post pump.  Urged mom to call lactation as needed.   Maternal Data    Feeding Feeding Type: Formula  LATCH Score                   Interventions    Lactation Tools Discussed/Used     Consult Status Consult Status: Follow-up Date: 07/04/20    Neomia Dear 07/03/2020, 4:38 PM

## 2020-07-03 NOTE — Lactation Note (Addendum)
This note was copied from a baby's chart. Lactation Consultation Note  Patient Name: Emily Valenzuela [facility] Date: 07/03/2020 Reason for consult: Follow-up assessment;Difficult latch;Early term 37-38.6wks;1st time breastfeeding Age:34 hours P1, 30 hour ETI female infant. Infant current feeding status is breast and formula feeding. Educational consult with in-depth  teaching. Infant had 5 voids and recently had large stool that MGM change ( meconium ) which according to [facility] was very large.  LC discussed input and output with mom.  LC entered the room ,mom had finished giving infant 60 mls of formula swaddled in clothing. Per mom, infant is not latching well, mom was given 16 mm NS due to  having flat nipples.  LC re-fitted mom with 20 mm and explained how to use. Per mom, she has been pumping every 30 minutes, LC discussed with mom she only needs to pump every 3 hours for 15 minutes on initial setting.  LC doesn't encourage her to pump every 30 minutes this may cause breast soreness and trauma. LC reviewed milk production and colostrum is in small amounts and infant's small tummy size. LC assisted with hand expression and mom saw that she has colostrum present in both breast.  LC discussed with mom that 60 mls was large volume, LC discussed pace feeding infant, when supplementing infant after latching infant at the breast- Day 2 offer maybe 12-15 mls of formula with pace feeding infant. Mom is " formula feeding only"  limit to 30 mls or less per feeding on  Day 2  with pace feeding.  Mom's current plans: 1- LC encouraged mom to latch infant at breast first with every feeding using a 20 mm NS, if mom needs assistance with latching infant at the breast to ask RN or LC, [facility] written name on mom's white board. 2- Mom will continue to breastfeed infant according to cues, 8 to 12+ times within 24 hours, STS and not swaddle in clothing. 3- Mom will supplement infant according to BF supplemental  sheet  Day 2 and do pace feeding with infant and burp infant while feeding, mom will offer any EBM she express first and then offer formula. 4-Mom will pump only every 3 hours for 15 minutes on initial setting and not every 30 minutes. 5- Mom will continue to work on latching infant at the breast.  6- Day 2 mom will not give infant 60 mls of formula at one feeding after latching infant at the breast.  Maternal Data    Feeding Feeding Type: Formula  LATCH Score                   Interventions Interventions: DEBP;Position options;Breast compression;Skin to skin  Lactation Tools Discussed/Used Tools: Pump Nipple shield size: 20 (Mom has flat nipples.) Breast pump type: Double-Electric Breast Pump   Consult Status Consult Status: Follow-up Date: 07/04/20 Follow-up type: In-patient    Danelle Earthly 07/03/2020, 5:00 PM

## 2020-07-03 NOTE — Progress Notes (Signed)
Subjective: Postpartum Day 1: Cesarean Delivery Patient reports tolerating PO, + flatus and no problems voiding.    Objective: Vital signs in last 24 hours: Temp:  [97.5 F (36.4 C)-98.5 F (36.9 C)] 97.8 F (36.6 C) (12/30 0602) Pulse Rate:  [65-96] 94 (12/30 0602) Resp:  [18-24] 18 (12/30 0602) BP: (109-122)/(51-93) 111/64 (12/30 0602) SpO2:  [96 %-100 %] 97 % (12/30 0602)  Physical Exam:  General: alert, cooperative, appears stated age and no distress Lochia: appropriate Uterine Fundus: firm Incision: Bandage, CDI DVT Evaluation: No evidence of DVT seen on physical exam. Negative Homan's sign. No cords or calf tenderness. No significant calf/ankle edema.  Recent Labs    06/30/20 1032 07/03/20 0628  HGB 11.8* 10.1*  HCT 34.6* 29.5*    Assessment/Plan: Status post Cesarean section. Doing well postoperatively.  Continue current care Desires Inpatient Circ Plan for DC tomorrow.  Victorino Dike B Cyndel Griffey 07/03/2020, 7:45 AM

## 2020-07-04 ENCOUNTER — Encounter (HOSPITAL_COMMUNITY): Payer: Self-pay | Admitting: Obstetrics and Gynecology

## 2020-07-04 MED ORDER — IBUPROFEN 800 MG PO TABS: 800.0000 mg | ORAL_TABLET | Freq: Four times a day (QID) | ORAL | 0 refills | Status: AC

## 2020-07-04 MED ORDER — ACETAMINOPHEN 500 MG PO TABS: 1000.0000 mg | ORAL_TABLET | Freq: Four times a day (QID) | ORAL | 0 refills | Status: AC

## 2020-07-04 MED ORDER — OXYCODONE HCL 5 MG PO TABS: 5.0000 mg | ORAL_TABLET | ORAL | 0 refills | Status: AC | PRN

## 2020-07-04 NOTE — Discharge Summary (Signed)
Repeat C/S OB Discharge Summary  Patient Name: Emily Valenzuela DOB: 1986/05/23 MRN: [id number]  Date of admission: 07/02/2020 Intrauterine pregnancy: [redacted]w[redacted]d   Admitting diagnosis: Encounter for maternal care for scar from repeat cesarean delivery [O34.219] History of cesarean section, classical [Z98.891] S/P cesarean section [Z98.891] Secondary diagnosis: None  Date of discharge: 07/04/2020    Discharge diagnosis: Term Pregnancy Delivered     Prenatal history: [id number]   EDC : 07/23/2020, by Last Menstrual Period  Prenatal care at [facility]  Primary provider : [facility] Prenatal course complicated by Hx of classical incision with fetal demise  Prenatal Labs: ABO, Rh: --/--/O POS (12/27 1035)  Antibody: NEG (12/27 1035) Rubella:  Immune RPR: NON REACTIVE (12/27 1032)  HBsAg:   NR HIV:   NR GBS:    Positive                                  Hospital course:  Sceduled C/S   34 y.o. yo [id number] at [redacted]w[redacted]d was admitted to the hospital 07/02/2020 for scheduled cesarean section with the following indication:Elective Repeat.Delivery details are as follows:  Membrane Rupture Time/Date: 10:37 AM ,07/02/2020   Delivery Method:C-Section, Low Transverse  Details of operation can be found in separate operative note.  Patient had an uncomplicated postpartum course.  She is ambulating, tolerating a regular diet, passing flatus, and urinating well. Patient is discharged home in stable condition on  07/04/20        Newborn Data: Birth date:07/02/2020  Birth time:10:38 AM  Gender:Female  Living status:Living  Apgars:8 ,8  Weight:2410 g    Delivering PROVIDER: Osborn Coho                                                            Complications: None  Newborn Data: Live born female  Birth Weight: 5 lb 5 oz (2410 g) APGAR: 8, 8  Newborn Delivery   Birth date/time: 07/02/2020 10:38:00 Delivery type: C-Section, Low Transverse Trial of labor: No C-section categorization: Repeat       Baby Feeding: Breast Disposition:home with mother  Contraception - unsure at present time  Labs: Lab Results  Component Value Date   WBC 11.1 (H) 07/03/2020   HGB 10.1 (L) 07/03/2020   HCT 29.5 (L) 07/03/2020   MCV 78.5 (L) 07/03/2020   PLT 288 07/03/2020   CMP Latest Ref Rng & Units 07/03/2020  Glucose 70 - 99 mg/dL 219(X)  BUN 6 - 20 mg/dL <5(O)  Creatinine 8.32 - 1.00 mg/dL 5.49  Sodium 826 - 415 mmol/L 134(L)  Potassium 3.5 - 5.1 mmol/L 3.7  Chloride 98 - 111 mmol/L 103  CO2 22 - 32 mmol/L 21(L)  Calcium 8.9 - 10.3 mg/dL 9.1    Physical Exam @ time of discharge:  Vitals:   07/03/20 0602 07/03/20 1503 07/03/20 2135 07/04/20 0615  BP: 111/64 (!) 108/55 124/61 133/68  Pulse: 94 83 97 99  Resp: 18 18 18 18   Temp: 97.8 F (36.6 C) 98.1 F (36.7 C) 98.1 F (36.7 C) 98.2 F (36.8 C)  TempSrc: Oral Oral Oral Oral  SpO2: 97%  98% 100%  Weight:      Height:       general:  alert, cooperative and no distress lochia: appropriate uterine fundus: firm perineum: intact incision: Dressing is clean, dry, and intact Extremities: WNL DVT Evaluation: No evidence of DVT seen on physical exam. Negative Homan's sign.  Discharge instructions:  "Baby and Me Booklet Discharge Medications: Tylenol, Motrin, Oxycodone 5mg  No current facility-administered medications on file prior to encounter.   Current Outpatient Medications on File Prior to Encounter  Medication Sig Dispense Refill  . Prenatal Vit-Fe Fumarate-FA (PRENATAL MULTIVITAMIN) TABS tablet Take 1 tablet by mouth daily at 12 noon.    . valACYclovir (VALTREX) 500 MG tablet Take 500 mg by mouth 2 (two) times daily.      Diet: routine diet Activity: Advance as tolerated. Pelvic rest x 6 weeks.  Follow up:1 week for incision check and 6 wks for pp visit. Dr aware of pt status and POC reviewed.  Signed: Su Hilt MSN, CNM 07/04/2020, 9:57 AM

## 2020-07-04 NOTE — Lactation Note (Signed)
This note was copied from a baby's chart. Lactation Consultation Note  Patient Name: Emily Valenzuela UXLKG'M Date: 07/04/2020 Reason for consult: Follow-up assessment;Early term 37-38.6wks;1st time breastfeeding Age:34 hours   P2 mother whose infatn is now 2 hours old.  This is an ETI at 37+0 weeks.weighing < 6 lbs.  Mother's feeding preference is breast/bottle.  Mother has a history of breast feeding for 7 months with her first child.  (Baby died at age 8).  Mother requested lactation assistance.  Mother attempting to latch baby when I arrived.  Offered to assist and mother agreeable.  Mother's breasts are large, soft and non tender and nipples are short shafted and intact.  Mother has a #16 and a #20 NS at bedside; tried latching without the NS, however, baby was too restless and irritable.  Mother has been using the curved tip syringe but baby not satisfied with this and it is hard for mother to use this method.  Suggested the 5 FR feeding tube under the NS and mother agreeable to try this method.  Reviewed the set up and feeding process.  Latched easily using the #20 NS and assisted baby to stay latched and feeding; flanged lips and baby very responsive to gentle stimulation.  He continued to feed well and consumed 23 mls of Neosure 22 calorie formula.  Mother felt a tug at her breast during feeding; denied pain.  Reiterated the importance of continuing to pump for 15 minutes after every feeding and to also continue supplementing after every feeding.  Mother had not been provided with the LPTI feeding guidelines and I reviewed these with her and grandmother.  Suggested grandmother do the supplementing while mother pumps.  Family willing to do this.  Reviewed pump settings and observed mother pumping with the #27 flanges.  She had no pain.  She will feed back any EBM she obtains to baby.  Asked her to call her RN/LC for any further questions/concerns.    RN updated.   Maternal Data Formula  Feeding for Exclusion: Yes Reason for exclusion: Mother's choice to formula and breast feed on admission Has patient been taught Hand Expression?: Yes  Feeding Feeding Type: Breast Fed  LATCH Score Latch: Grasps breast easily, tongue down, lips flanged, rhythmical sucking.  Audible Swallowing: Spontaneous and intermittent (with NS)  Type of Nipple: Everted at rest and after stimulation (short shafted)  Comfort (Breast/Nipple): Soft / non-tender  Hold (Positioning): Assistance needed to correctly position infant at breast and maintain latch.  LATCH Score: 9  Interventions Interventions: Breast feeding basics reviewed;Assisted with latch;Skin to skin;Breast massage;Hand express;Breast compression;Adjust position;DEBP;Hand pump;Shells;Position options;Support pillows  Lactation Tools Discussed/Used Tools: Shells;Pump;Flanges;Nipple Shields;78F feeding tube / Syringe Nipple shield size: 20 Flange Size: 27 Shell Type: Inverted Breast pump type: Double-Electric Breast Pump;Manual Pump Education: Setup, frequency, and cleaning (Reviewed)   Consult Status Consult Status: Follow-up Date: 07/05/20 Follow-up type: In-patient    Emily Valenzuela R Bren Steers 07/04/2020, 10:51 AM

## 2020-07-07 MED FILL — Sodium Chloride IV Soln 0.9%: INTRAVENOUS | Qty: 1000 | Status: AC

## 2020-07-07 MED FILL — Heparin Sodium (Porcine) Inj 1000 Unit/ML: INTRAMUSCULAR | Qty: 30 | Status: AC

## 2020-07-23 ENCOUNTER — Inpatient Hospital Stay (HOSPITAL_COMMUNITY): Admit: 2020-07-23 | Payer: Self-pay

## 2020-08-22 ENCOUNTER — Encounter (HOSPITAL_COMMUNITY): Payer: Self-pay | Admitting: Obstetrics and Gynecology

## 2020-08-28 ENCOUNTER — Encounter (HOSPITAL_COMMUNITY): Payer: Self-pay | Admitting: Obstetrics and Gynecology

## 2022-03-07 IMAGING — US US OB < 14 WEEKS - US OB TV
1 series · 15 of 28 positions shown · non-contrast
Comparison: None.

CLINICAL DATA: First trimester pregnancy. Concern for ectopic
pregnancy. Assess viability. LMP 10/17/2019.

EXAM:
OBSTETRIC <14 WK US AND TRANSVAGINAL OB US
TECHNIQUE: Both transabdominal and transvaginal ultrasound examinations were
performed for complete evaluation of the gestation as well as the
maternal uterus, adnexal regions, and pelvic cul-de-sac.
Transvaginal technique was performed to assess early pregnancy.

[Series 1: us ob < 14 weeks - us ob tv · 15 of 71 slices shown]
[im 1/71]
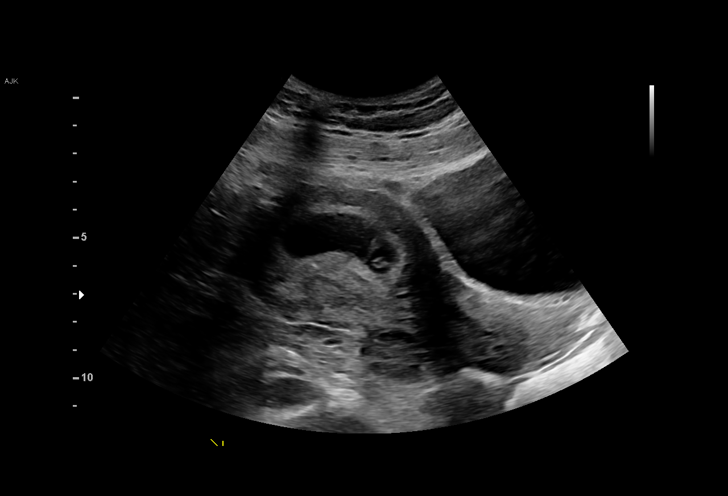
[im 6/71]
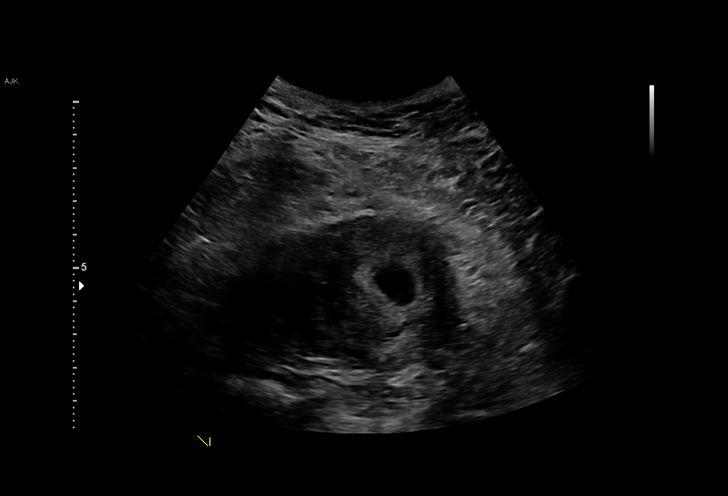
[im 11/71]
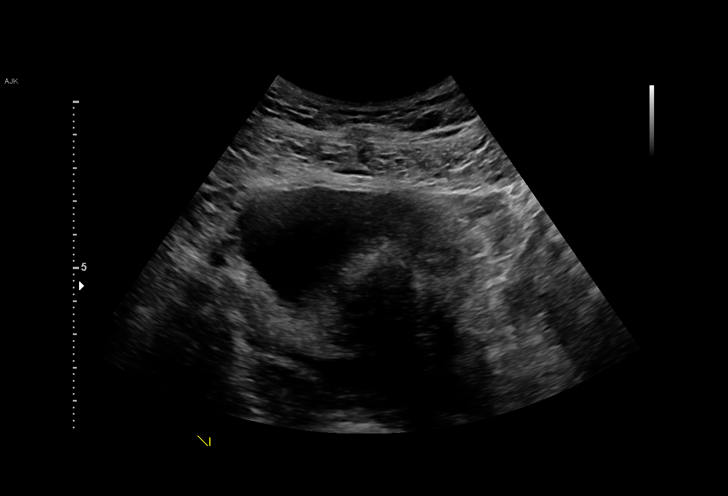
[im 16/71]
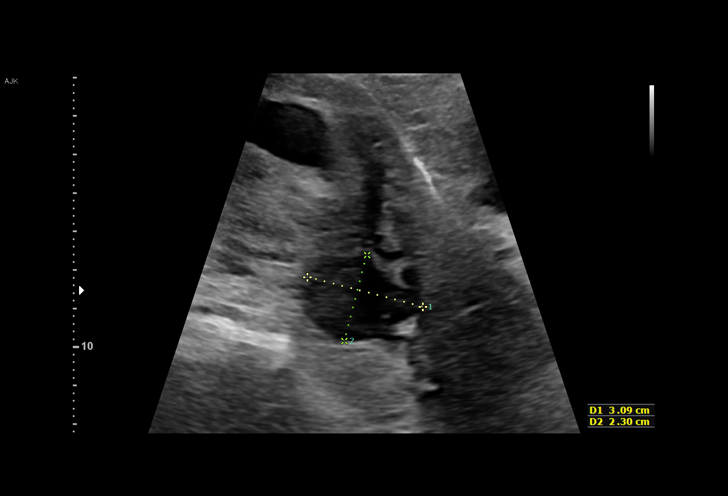
[im 21/71]
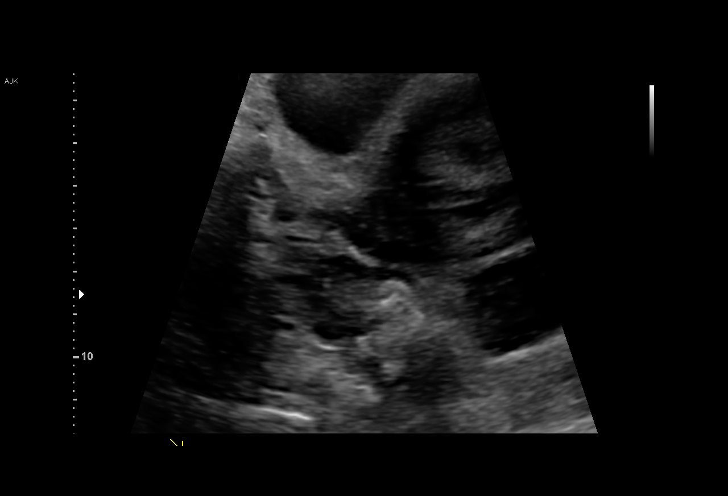
[im 26/71]
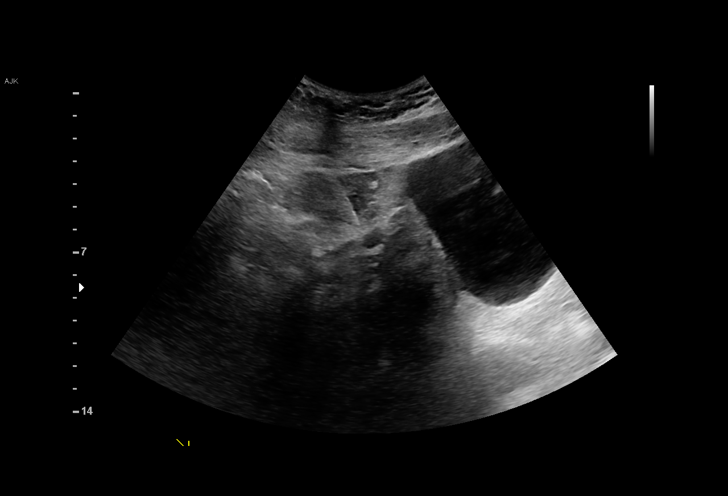
[im 32/71]
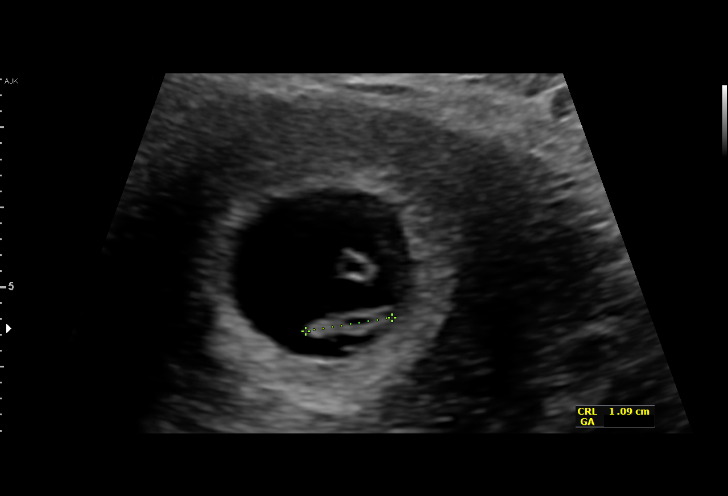
[im 37/71]
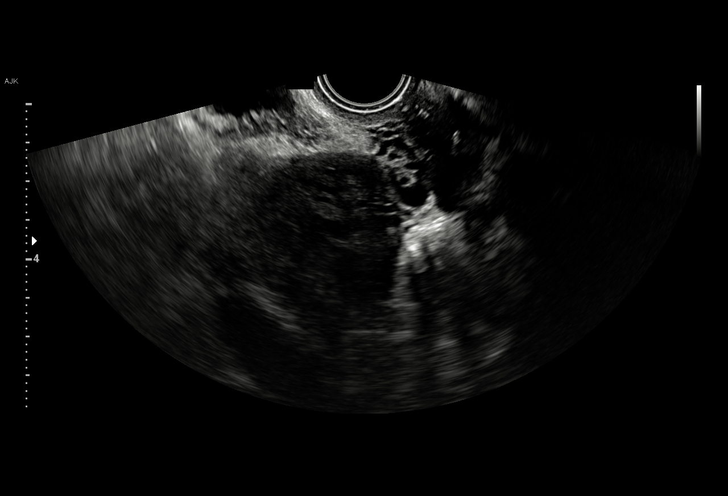
[im 39/71]
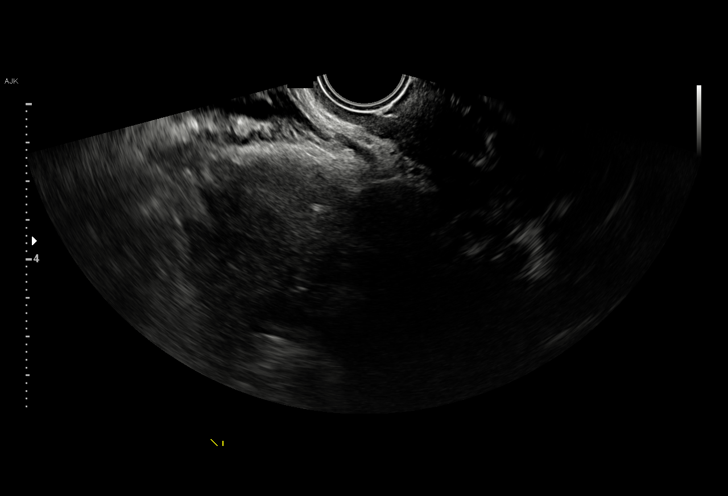
[im 45/71]
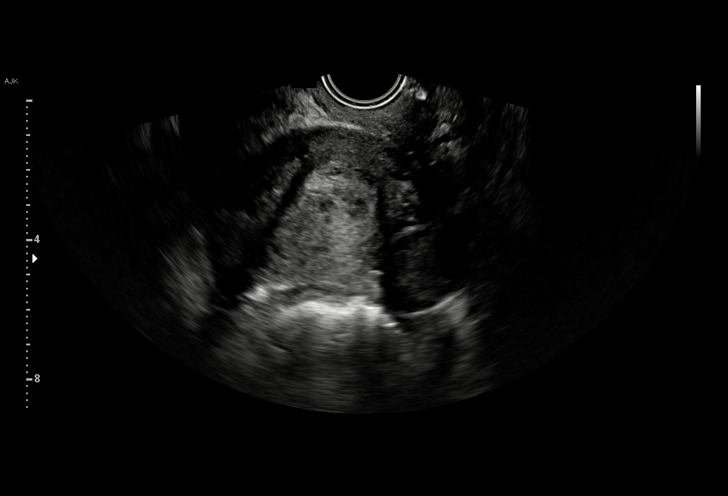
[im 50/71]
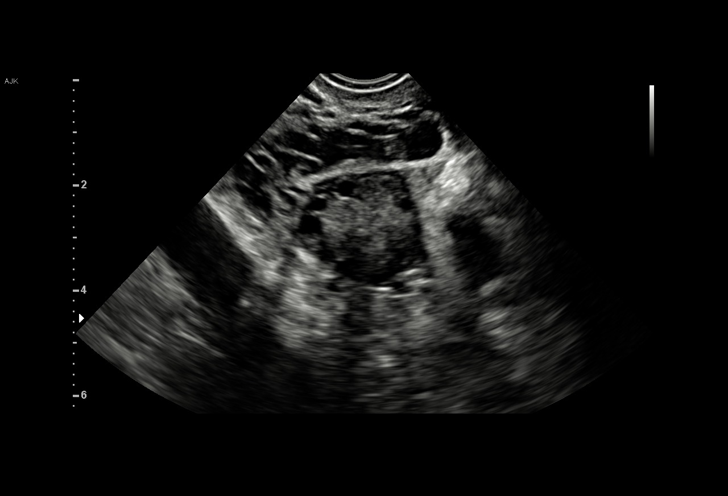
[im 55/71]
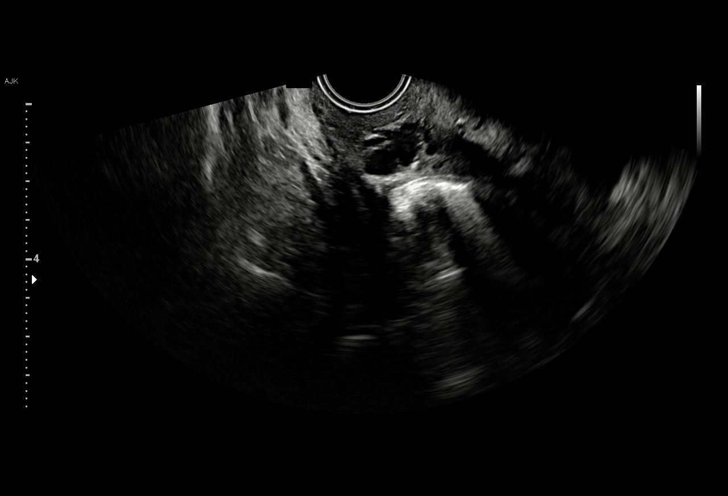
[im 60/71]
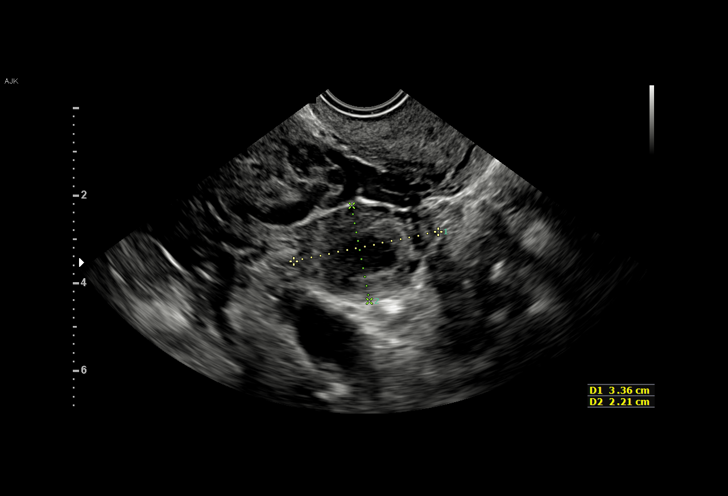
[im 65/71]
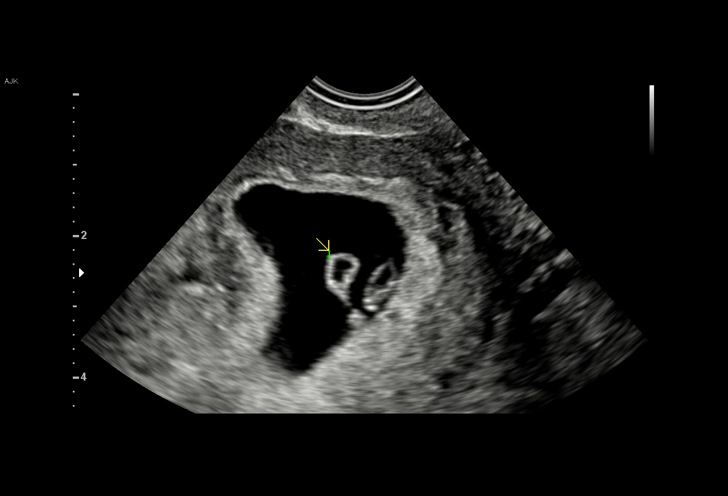
[im 71/71]
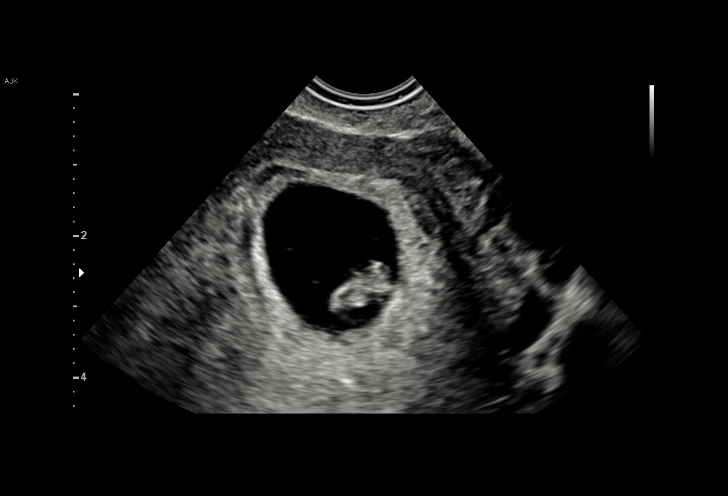

[15 of 28 positions shown; findings below may reference images not displayed]

FINDINGS: Intrauterine gestational sac: Visualized/normal in shape.

Yolk sac:  Visualized.

Embryo:  Visualized.

Cardiac Activity: Visualized.

Heart Rate: 129 bpm

CRL:  10.7 mm; 7 w 1 d;                  US EDC: 07/28/2020

Subchorionic hemorrhage: None.

Maternal uterus/adnexae: Both maternal ovaries are visualized. There
is a small corpus luteal cyst on the left. No adnexal mass or
significant free pelvic fluid.
IMPRESSION: 1. Single live intrauterine gestation with best estimated
gestational age of 7 weeks 1 day.
2. No evidence of adnexal mass.

## 2022-06-23 IMAGING — US US MFM OB DETAIL+14 WK
1 series · 13 of 28 positions shown · non-contrast
Comparison: none

[Series 1: us mfm ob detail+14 wk · 62 acquisitions, 13 frames shown]
[im 3/62]
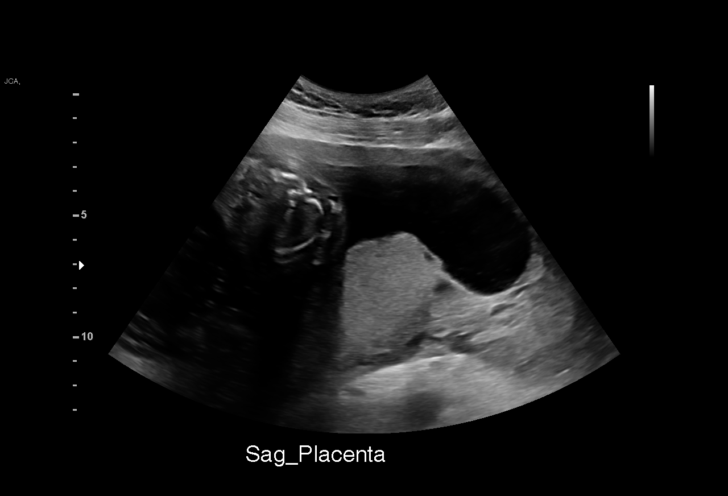
[im 7/62]
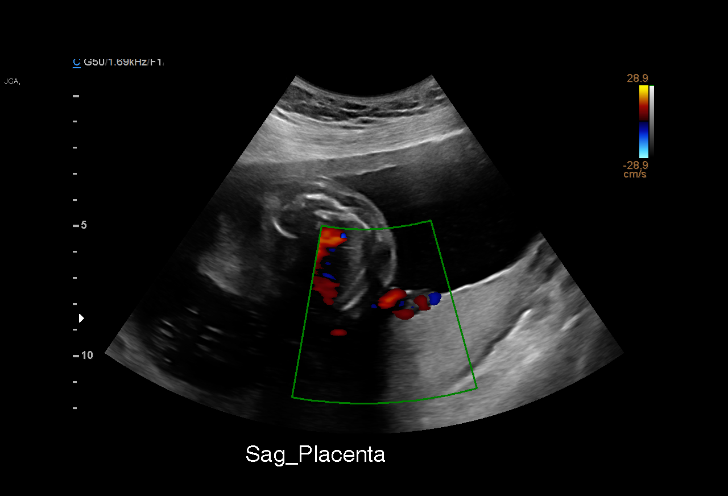
[im 12/62]
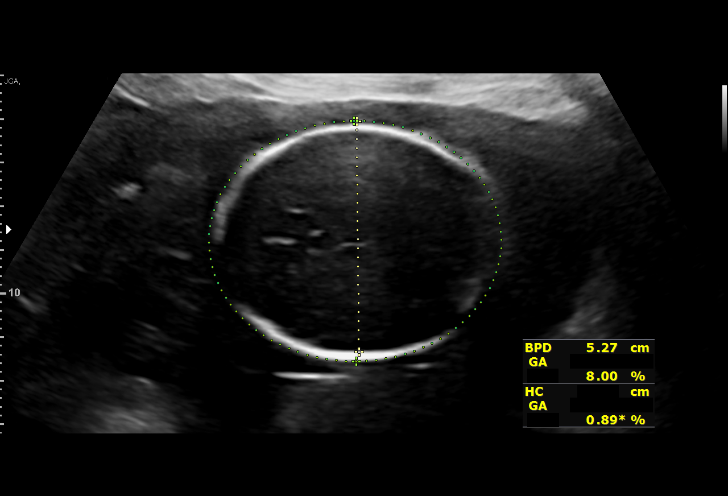
[im 16/62]
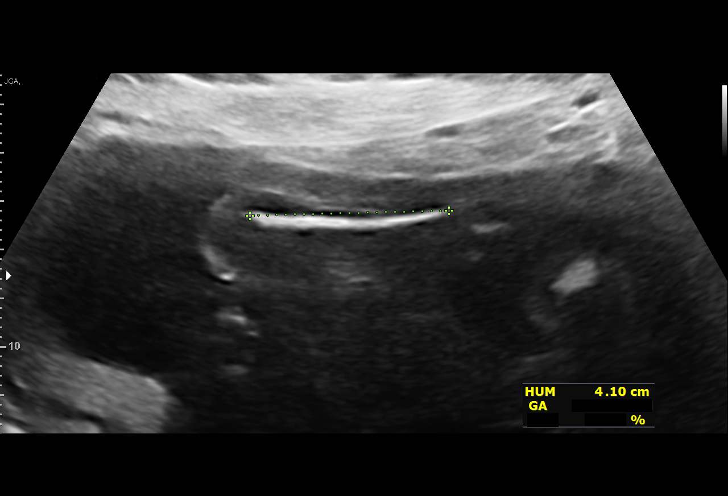
[im 21/62]
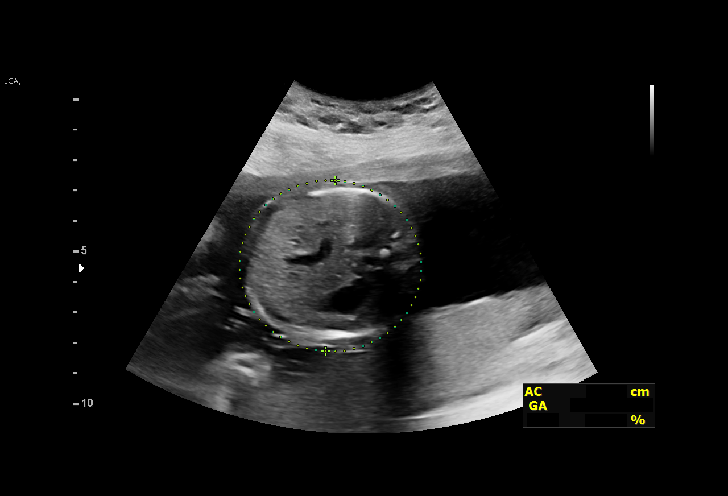
[im 25/62]
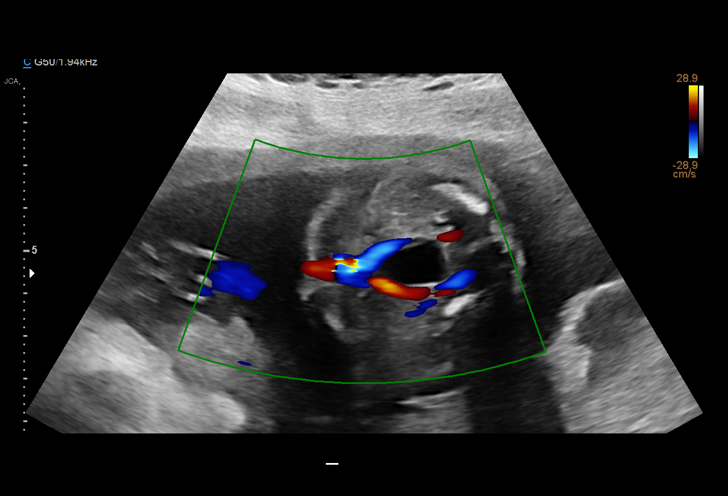
[im 32/62]
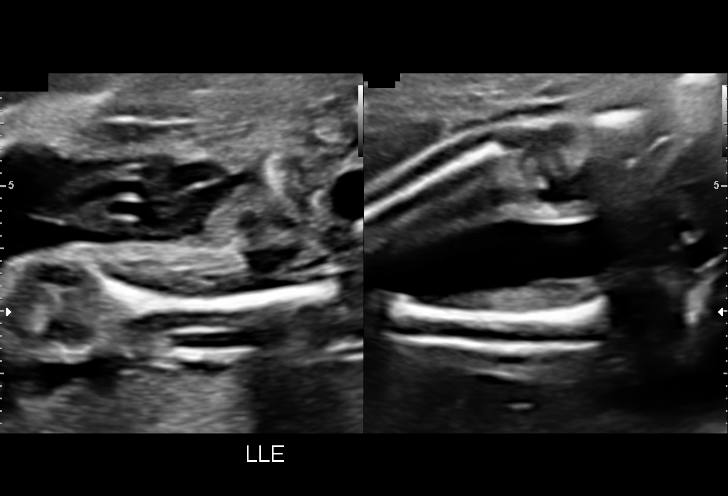
[im 37/62]
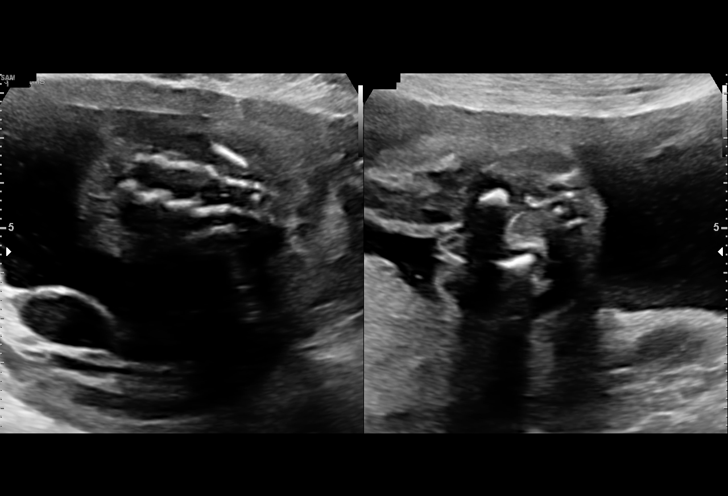
[im 41/62]
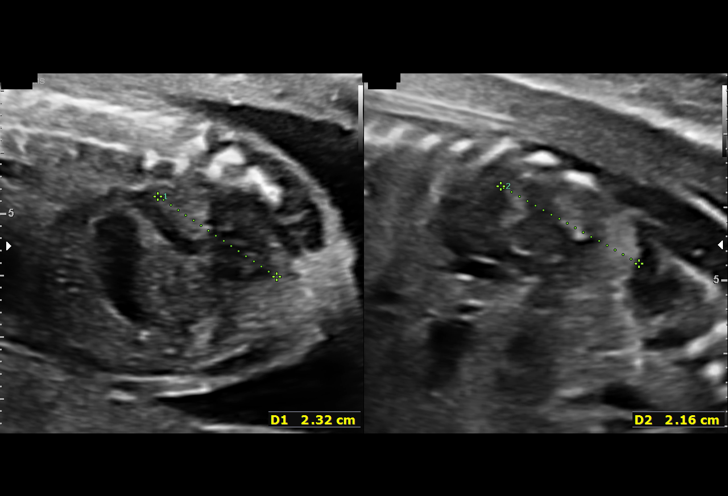
[im 46/62]
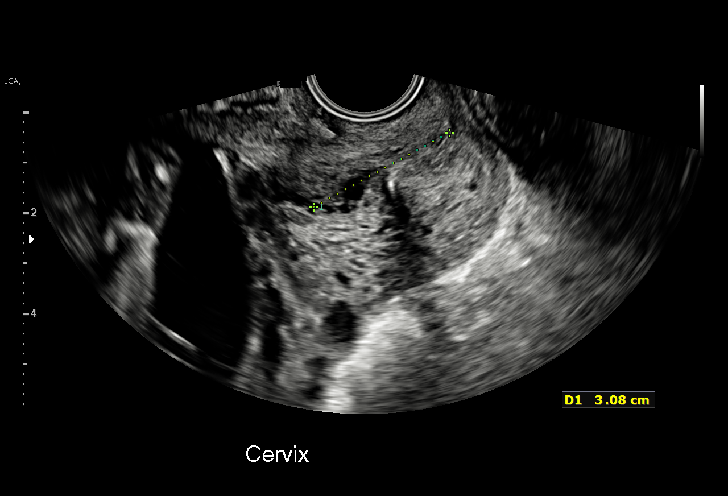
[im 50/62]
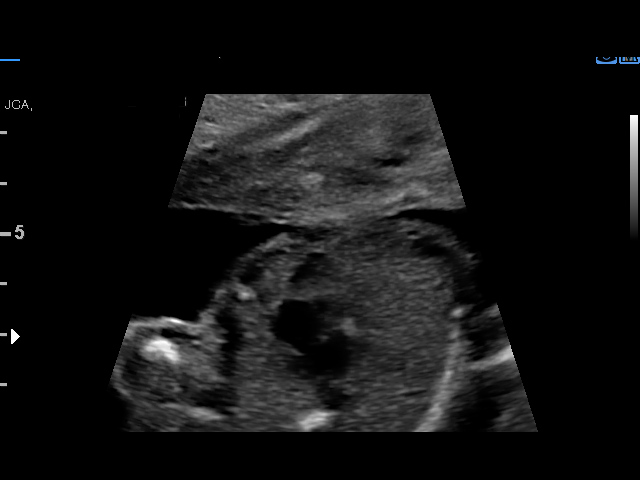
[im 55/62]
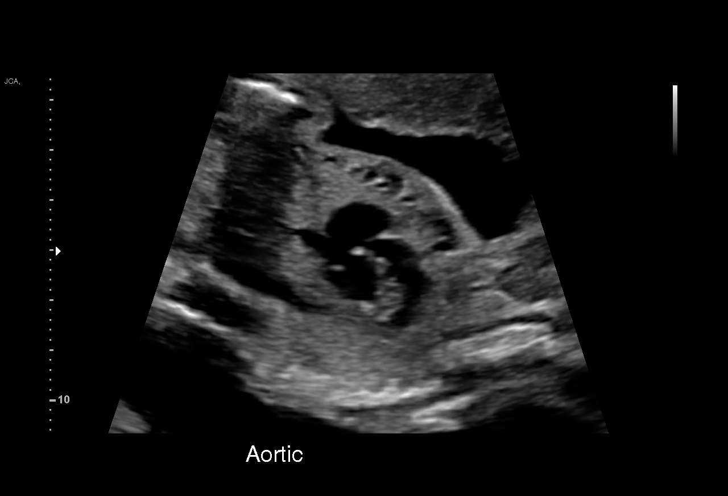
[im 59/62]
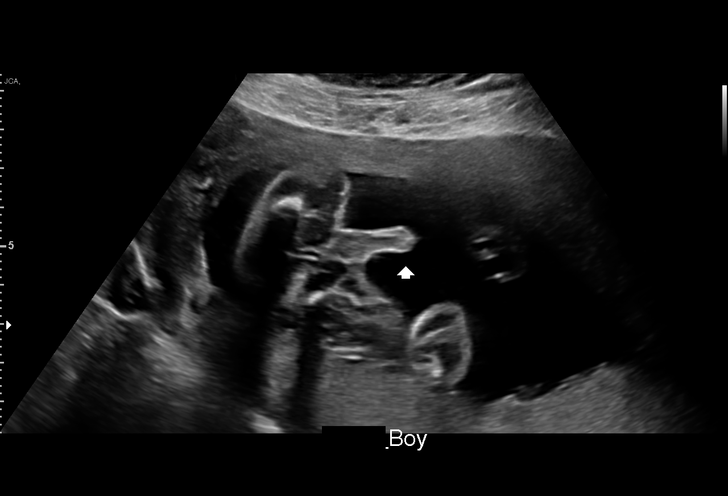

[13 of 28 positions shown; findings below may reference images not displayed]

1  US MFM OB DETAIL +14 WK               76811.01    ERMIN EKO EFIMOVNA
 2  US MFM OB TRANSVAGINAL                76817.2     ERMIN EKO EFIMOVNA

Indications

 Obesity complicating pregnancy, second
 trimester (BMI 42)
 Cervical incompetence, second trimester
 Cervical cerclage suture present, second
 trimester (02/25/20)
 History of cesarean delivery, currently
 pregnant
 Poor obstetric history: Previous preterm
 delivery, antepartum
 Encounter for antenatal screening for
 malformations
 23 weeks gestation of pregnancy
 Low Risk NIPS, Declined AFP
Fetal Evaluation

 Num Of Fetuses:         1
 Fetal Heart Rate(bpm):  141
 Cardiac Activity:       Observed
 Presentation:           Transverse, head to maternal right
 Placenta:               Posterior
 P. Cord Insertion:      Not well visualized

 Amniotic Fluid
 AFI FV:      Within normal limits

                             Largest Pocket(cm)

Biometry

 BPD:      53.5  mm     G. Age:  22w 2d         12  %    CI:        74.33   %    70 - 86
                                                         FL/HC:      20.8   %    19.2 -
 HC:       197   mm     G. Age:  21w 6d        2.7  %    HC/AC:      1.08        1.05 -
 AC:      181.9  mm     G. Age:  23w 0d         33  %    FL/BPD:     76.6   %    71 - 87
 FL:         41  mm     G. Age:  23w 2d         38  %    FL/AC:      22.5   %    20 - 24
 HUM:      40.9  mm     G. Age:  24w 6d         76  %

 Est. FW:     552  gm      1 lb 3 oz     28  %
OB History

 Gravidity:    4         Term:   0        Prem:   1        SAB:   1
 TOP:          1        Living:  0
Gestational Age

 LMP:           23w 2d        Date:  10/17/19                 EDD:   07/23/20
 U/S Today:     22w 4d                                        EDD:   07/28/20
 Best:          23w 2d     Det. By:  LMP  (10/17/19)          EDD:   07/23/20
Anatomy

 Cranium:               Appears normal         Aortic Arch:            Appears normal
 Cavum:                 Not well visualized    Ductal Arch:            Appears normal
 Ventricles:            Not well visualized    Diaphragm:              Appears normal
 Choroid Plexus:        Appears normal         Stomach:                Appears normal, left
                                                                       sided
 Cerebellum:            Not well visualized    Abdomen:                Appears normal
 Posterior Fossa:       Not well visualized    Abdominal Wall:         Appears nml (cord
                                                                       insert, abd wall)
 Nuchal Fold:           Not applicable (>20    Cord Vessels:           Appears normal (3
                        wks GA)                                        vessel cord)
 Face:                  Orbits nl; profile not Kidneys:                Appear normal
                        well visualized
 Lips:                  Appears normal         Bladder:                Appears normal
 Thoracic:              Appears normal         Spine:                  Limited views
                                                                       appear normal
 Heart:                 Appears normal         Upper Extremities:      RUE visualized;
                        (4CH, axis, and                                LUE NWS
                        situs)
 RVOT:                  Not well visualized    Lower Extremities:      Appears normal
 LVOT:                  Appears normal

 Other:  Normal genaitalia. Fetus appears to be a male. Technically difficult
         due to maternal habitus and fetal position.
Cervix Uterus Adnexa

 Cervix
 Length:            3.1  cm.
Impression

 Ms. Durst, Takako4 P0 at 23-weeks' gestation, is here for fetal
 anatomy scan. Obstetric history is significant for a preterm
 cesarean delivery at 24 -weeks' gestation. Unfortunately, the
 infant died after birth.
 Patient had prophylactic cerclage in this pregnancy at 18-
 weeks' gestation. Patient reports no chronic medical
 conditions.
 We performed a fetal anatomy scan. No markers of
 aneuploidies or fetal structural defects are seen. Fetal
 biometry is consistent with her previously-established dates.
 Amniotic fluid is normal and good fetal activity is seen.
 Patient understands the limitations of ultrasound in detecting
 fetal anomalies.

 We performed transvaginal ultrasound to evaluate the cervix.
 The cervix measures 3.1 cm (1.4 cm distal to the cerclage). I
 reassured the patient of the findings.
 Maternal obesity imposes limitations on the resolution of
 images, and failure to detect fetal anomalies is more common
 in obese pregnant women. As maternal obesity makes
 clinical assessment of fetal growth difficult, we recommend
 serial growth scans until delivery.
Recommendations

 -An appointment was made for her to return in 4 weeks for
 completion of fetal anatomy.
                 Belonio, Lai May

## 2022-10-20 IMAGING — US US MFM FETAL BPP W/O NON-STRESS
1 series · 14 of 28 positions shown · non-contrast
Comparison: none

[Series 1: us mfm fetal bpp w/o non-stress · 36 acquisitions, 14 frames shown]
[im 2/36]
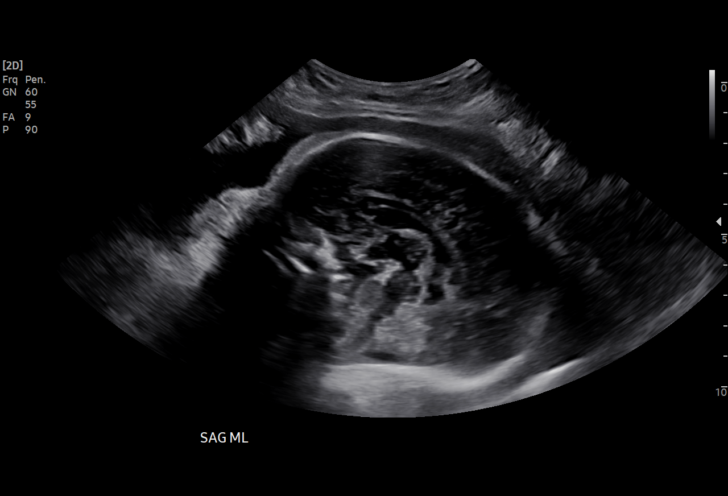
[im 4/36]
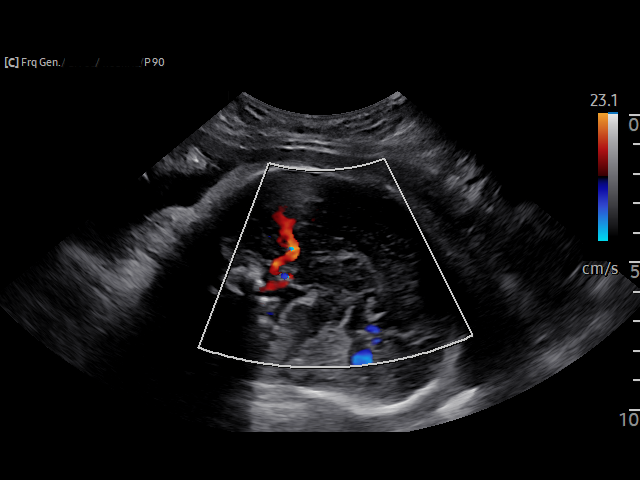
[im 7/36]
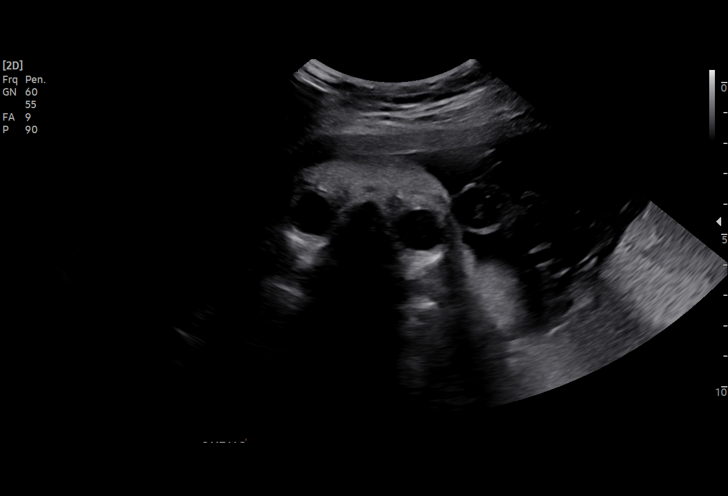
[im 10/36]
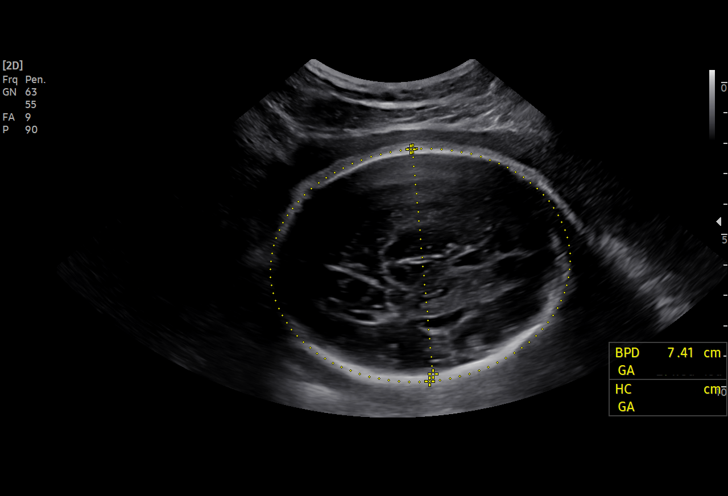
[im 12/36]
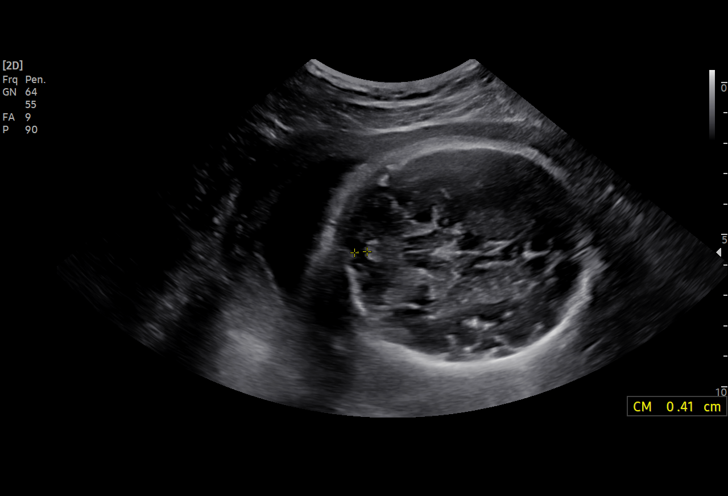
[im 15/36]
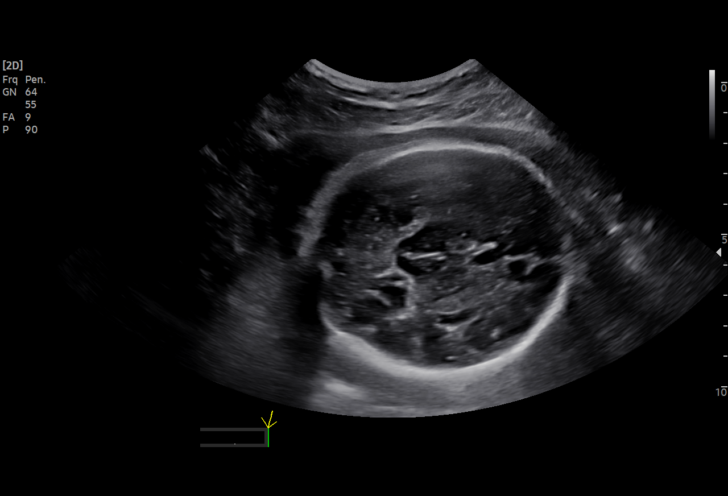
[im 17/36]
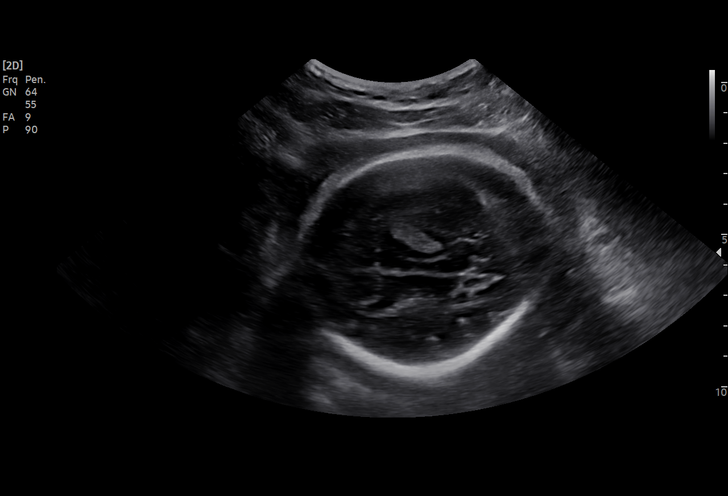
[im 20/36]
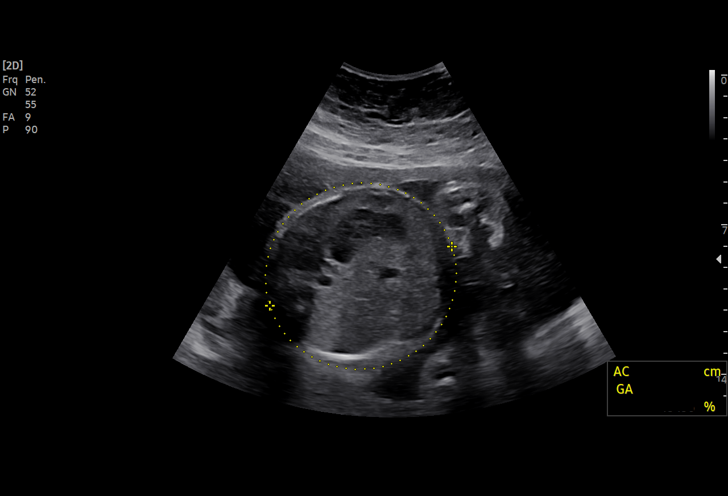
[im 23/36]
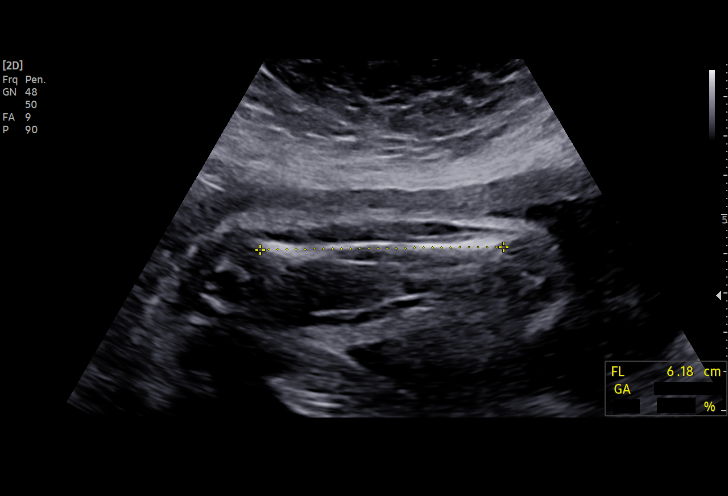
[im 25/36]
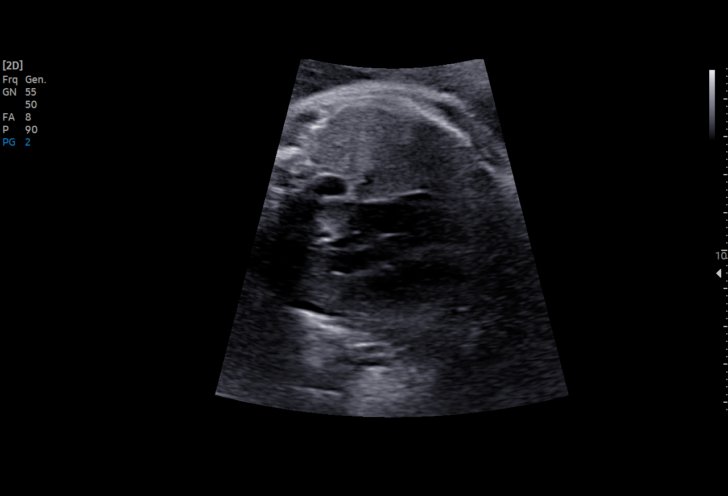
[im 28/36]
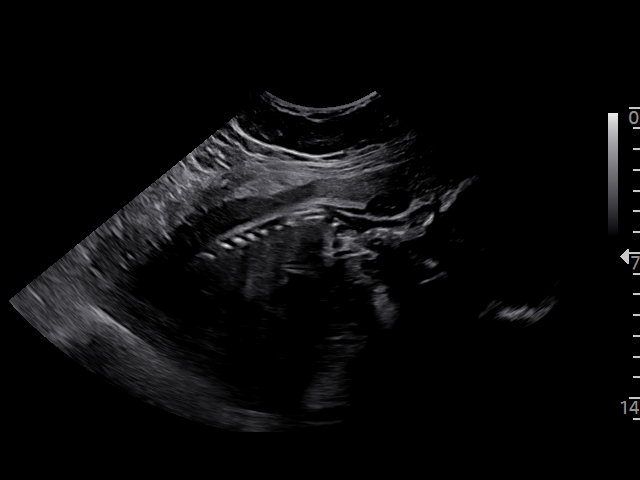
[im 30/36]
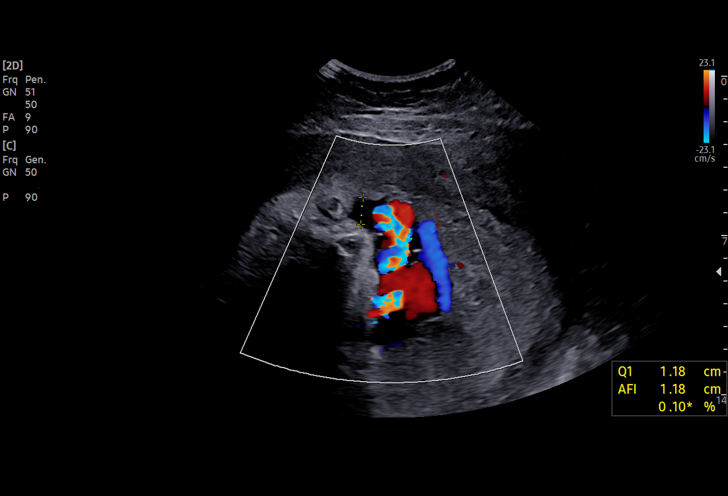
[im 33/36]
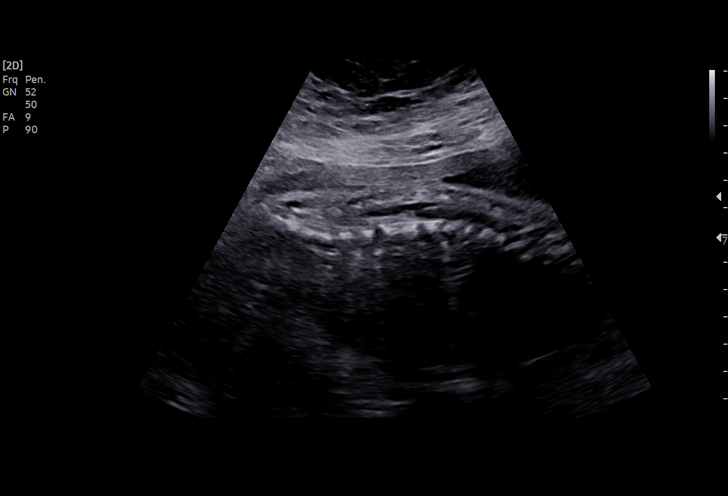
[im 36/36]
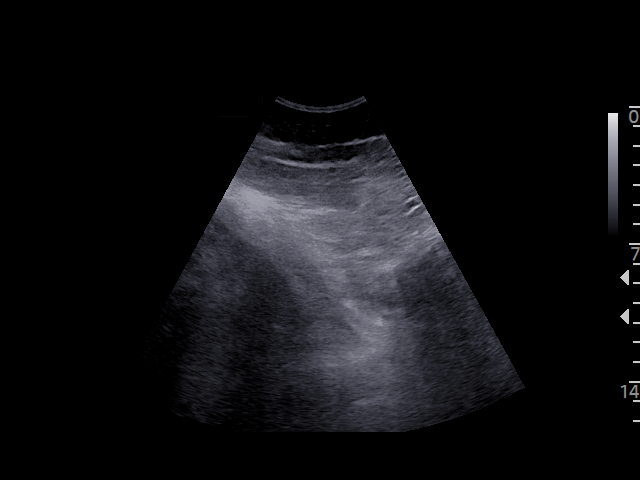

[14 of 28 positions shown; findings below may reference images not displayed]

Indications

 Obesity complicating pregnancy, second
 trimester (BMI 42)
 Cervical incompetence, second trimester
 Cervical cerclage suture present, second
 trimester (02/25/20)
 History of cesarean delivery, currently
 pregnant
 Poor obstetric history: Previous preterm
 delivery, antepartum
 Encounter for antenatal screening for
 malformations
 31 weeks gestation of pregnancy
 Low Risk NIPS, Declined AFP
Fetal Evaluation

 Num Of Fetuses:         1
 Cardiac Activity:       Observed
 Presentation:           Cephalic
 Placenta:               Posterior
 P. Cord Insertion:      Not well visualized

 Amniotic Fluid
 AFI FV:      Within normal limits

 AFI Sum(cm)     %Tile       Largest Pocket(cm)
 14.68           51
 RUQ(cm)       RLQ(cm)       LUQ(cm)        LLQ(cm)

Biophysical Evaluation

 Amniotic F.V:   Pocket => 2 cm             F. Tone:        Observed
 F. Movement:    Observed                   Score:          [DATE]
 F. Breathing:   Observed
Biometry

 BPD:      73.9  mm     G. Age:  29w 5d        1.9  %    CI:        73.26   %    70 - 86
                                                         FL/HC:      22.5   %    19.1 -
 HC:      274.4  mm     G. Age:  30w 0d        < 1  %    HC/AC:      1.01        0.96 -
 AC:      272.4  mm     G. Age:  31w 2d         32  %    FL/BPD:     83.6   %    71 - 87
 FL:       61.8  mm     G. Age:  32w 0d         42  %    FL/AC:      22.7   %    20 - 24
 CER:        38  mm     G. Age:  31w 1d         14  %
 LV:        3.5  mm
 CM:        4.1  mm

 Est. FW:    9546  gm    3 lb 13 oz      22  %
OB History

 Gravidity:    4         Term:   0        Prem:   1        SAB:   1
 TOP:          1        Living:  0
Gestational Age

 LMP:           31w 6d        Date:  10/17/19                 EDD:   07/23/20
 U/S Today:     30w 5d                                        EDD:   07/31/20
 Best:          31w 6d     Det. By:  LMP  (10/17/19)          EDD:   07/23/20
Anatomy

 Cranium:               Appears normal         LVOT:                   Appears normal
 Cavum:                 Appears normal         Aortic Arch:            Appears normal
 Ventricles:            Appears normal         Ductal Arch:            Appears normal
 Choroid Plexus:        Appears normal         Diaphragm:              Appears normal
 Cerebellum:            Appears normal         Stomach:                Appears normal, left
                                                                       sided
 Posterior Fossa:       Appears normal         Abdomen:                Previously seen
 Nuchal Fold:           Not applicable (>20    Abdominal Wall:         Appears nml (cord
                        wks GA)                                        insert, abd wall)
 Face:                  Appears normal         Cord Vessels:           Previously seen
                        (orbits and profile)
 Lips:                  Appears normal         Kidneys:                Appear normal
 Palate:                Not well visualized    Bladder:                Appears normal
 Thoracic:              Appears normal         Spine:                  Appears normal
 Heart:                 Appears normal         Upper Extremities:      Appears normal
                        (4CH, axis, and
                        situs)
 RVOT:                  Appears normal         Lower Extremities:      Previously seen

 Other:  Fetus appears to be a male. Nasal bone visualized. Hands not well
         visualized. VC, 3VV and 3VTV visualized. Technicallly difficult due to
         advanced GA and maternal habitus.
Cervix Uterus Adnexa

 Cervix
 Not visualized (advanced GA >42wks)
Impression

 Follow up growth to complete fetal anatomy and elevate BMI.
 Normal interval growth with measurements consistent with
 dates
 Good fetal movement and amniotic fluid volume
 Biophysical profile [DATE] with good fetal movement and
 amniotic fluid volume.
Recommendations

 Follow up growth in 4 weeks
 Initiate weekly testing at 36 weeks.
